# Patient Record
Sex: Female | Born: 1970 | Race: White | Hispanic: No | Marital: Single | Smoking: Never smoker
Health system: Southern US, Community
[De-identification: ages and names within clinical notes are randomized; demographics above are authoritative.]

## PROBLEM LIST (undated history)

## (undated) DIAGNOSIS — Z8481 Family history of carrier of genetic disease: Secondary | ICD-10-CM

## (undated) DIAGNOSIS — J189 Pneumonia, unspecified organism: Secondary | ICD-10-CM

## (undated) DIAGNOSIS — D649 Anemia, unspecified: Secondary | ICD-10-CM

## (undated) DIAGNOSIS — I499 Cardiac arrhythmia, unspecified: Secondary | ICD-10-CM

## (undated) DIAGNOSIS — F329 Major depressive disorder, single episode, unspecified: Secondary | ICD-10-CM

## (undated) DIAGNOSIS — M797 Fibromyalgia: Secondary | ICD-10-CM

## (undated) DIAGNOSIS — E785 Hyperlipidemia, unspecified: Secondary | ICD-10-CM

## (undated) DIAGNOSIS — C801 Malignant (primary) neoplasm, unspecified: Secondary | ICD-10-CM

## (undated) DIAGNOSIS — J40 Bronchitis, not specified as acute or chronic: Secondary | ICD-10-CM

## (undated) DIAGNOSIS — F32A Depression, unspecified: Secondary | ICD-10-CM

## (undated) DIAGNOSIS — T7840XA Allergy, unspecified, initial encounter: Secondary | ICD-10-CM

## (undated) DIAGNOSIS — D259 Leiomyoma of uterus, unspecified: Secondary | ICD-10-CM

## (undated) DIAGNOSIS — I493 Ventricular premature depolarization: Secondary | ICD-10-CM

## (undated) DIAGNOSIS — K5792 Diverticulitis of intestine, part unspecified, without perforation or abscess without bleeding: Secondary | ICD-10-CM

## (undated) DIAGNOSIS — K219 Gastro-esophageal reflux disease without esophagitis: Secondary | ICD-10-CM

## (undated) DIAGNOSIS — Z8719 Personal history of other diseases of the digestive system: Secondary | ICD-10-CM

## (undated) DIAGNOSIS — B009 Herpesviral infection, unspecified: Secondary | ICD-10-CM

## (undated) DIAGNOSIS — R109 Unspecified abdominal pain: Secondary | ICD-10-CM

## (undated) DIAGNOSIS — F900 Attention-deficit hyperactivity disorder, predominantly inattentive type: Secondary | ICD-10-CM

## (undated) DIAGNOSIS — E282 Polycystic ovarian syndrome: Secondary | ICD-10-CM

## (undated) HISTORY — DX: Gastro-esophageal reflux disease without esophagitis: K21.9

## (undated) HISTORY — DX: Malignant (primary) neoplasm, unspecified: C80.1

## (undated) HISTORY — PX: SQUAMOUS CELL CARCINOMA EXCISION: SHX2433

## (undated) HISTORY — DX: Polycystic ovarian syndrome: E28.2

## (undated) HISTORY — DX: Diverticulitis of intestine, part unspecified, without perforation or abscess without bleeding: K57.92

## (undated) HISTORY — DX: Herpesviral infection, unspecified: B00.9

## (undated) HISTORY — DX: Unspecified abdominal pain: R10.9

## (undated) HISTORY — PX: WISDOM TOOTH EXTRACTION: SHX21

## (undated) HISTORY — DX: Major depressive disorder, single episode, unspecified: F32.9

## (undated) HISTORY — DX: Allergy, unspecified, initial encounter: T78.40XA

## (undated) HISTORY — DX: Anemia, unspecified: D64.9

## (undated) HISTORY — DX: Fibromyalgia: M79.7

## (undated) HISTORY — DX: Hyperlipidemia, unspecified: E78.5

## (undated) HISTORY — PX: APPENDECTOMY: SHX54

## (undated) HISTORY — PX: DILATION AND CURETTAGE OF UTERUS: SHX78

## (undated) HISTORY — DX: Attention-deficit hyperactivity disorder, predominantly inattentive type: F90.0

## (undated) HISTORY — DX: Leiomyoma of uterus, unspecified: D25.9

## (undated) HISTORY — PX: COLON SURGERY: SHX602

## (undated) HISTORY — DX: Family history of carrier of genetic disease: Z84.81

## (undated) HISTORY — DX: Depression, unspecified: F32.A

## (undated) HISTORY — DX: Ventricular premature depolarization: I49.3

## (undated) HISTORY — PX: COLONOSCOPY: SHX174

---

## 1995-11-27 HISTORY — PX: SIGMOIDECTOMY: SHX176

## 1998-05-09 ENCOUNTER — Other Ambulatory Visit: Admission: RE | Admit: 1998-05-09 | Discharge: 1998-05-09 | Payer: Self-pay | Admitting: Family Medicine

## 1999-05-18 ENCOUNTER — Ambulatory Visit (HOSPITAL_COMMUNITY): Admission: RE | Admit: 1999-05-18 | Discharge: 1999-05-18 | Payer: Self-pay | Admitting: Gastroenterology

## 1999-05-26 ENCOUNTER — Encounter: Admission: RE | Admit: 1999-05-26 | Discharge: 1999-06-09 | Payer: Self-pay | Admitting: Family Medicine

## 1999-12-22 ENCOUNTER — Ambulatory Visit (HOSPITAL_COMMUNITY): Admission: RE | Admit: 1999-12-22 | Discharge: 1999-12-22 | Payer: Self-pay | Admitting: Cardiology

## 2000-08-16 ENCOUNTER — Encounter: Admission: RE | Admit: 2000-08-16 | Discharge: 2000-08-16 | Payer: Self-pay | Admitting: Family Medicine

## 2000-08-16 ENCOUNTER — Encounter: Payer: Self-pay | Admitting: Family Medicine

## 2000-10-21 ENCOUNTER — Other Ambulatory Visit: Admission: RE | Admit: 2000-10-21 | Discharge: 2000-10-21 | Payer: Self-pay | Admitting: Family Medicine

## 2002-03-17 ENCOUNTER — Other Ambulatory Visit: Admission: RE | Admit: 2002-03-17 | Discharge: 2002-03-17 | Payer: Self-pay | Admitting: Family Medicine

## 2002-04-29 ENCOUNTER — Ambulatory Visit (HOSPITAL_BASED_OUTPATIENT_CLINIC_OR_DEPARTMENT_OTHER): Admission: RE | Admit: 2002-04-29 | Discharge: 2002-04-29 | Payer: Self-pay | Admitting: Family Medicine

## 2003-03-31 ENCOUNTER — Other Ambulatory Visit: Admission: RE | Admit: 2003-03-31 | Discharge: 2003-03-31 | Payer: Self-pay | Admitting: Obstetrics and Gynecology

## 2004-06-08 ENCOUNTER — Other Ambulatory Visit: Admission: RE | Admit: 2004-06-08 | Discharge: 2004-06-08 | Payer: Self-pay | Admitting: Obstetrics and Gynecology

## 2004-11-01 ENCOUNTER — Emergency Department (HOSPITAL_COMMUNITY): Admission: EM | Admit: 2004-11-01 | Discharge: 2004-11-01 | Payer: Self-pay | Admitting: Emergency Medicine

## 2006-01-31 ENCOUNTER — Encounter: Admission: RE | Admit: 2006-01-31 | Discharge: 2006-01-31 | Payer: Self-pay | Admitting: Family Medicine

## 2006-09-24 ENCOUNTER — Encounter: Admission: RE | Admit: 2006-09-24 | Discharge: 2006-09-24 | Payer: Self-pay | Admitting: Family Medicine

## 2006-12-20 ENCOUNTER — Ambulatory Visit (HOSPITAL_COMMUNITY): Admission: RE | Admit: 2006-12-20 | Discharge: 2006-12-20 | Payer: Self-pay | Admitting: Gastroenterology

## 2007-02-07 IMAGING — CT CT ABDOMEN W/ CM
2 of 5 series · 14 of 42 positions shown, 18 images · IV contrast (READY/WATER & [ID] OMNI 300)
Comparison: none

CLINICAL DATA: 33-year-old female, left lower quadrant acute abdominal pain.  History of diverticulitis.  
ABDOMEN CT WITH CONTRAST:
TECHNIQUE: Multidetector CT imaging of the abdomen was performed following the standard protocol during bolus administration of intravenous contrast.
Contrast:  125 cc Omnipaque 300
TECHNIQUE: Multidetector CT imaging of the pelvis was performed following the standard protocol during bolus administration of intravenous contrast.

[Series 102: a&p w/ · axial · 0.70mm/px · z∈[-412,-3]mm · 11 of 377 slices shown, 15 images]
[im 33/377  soft-tissue]
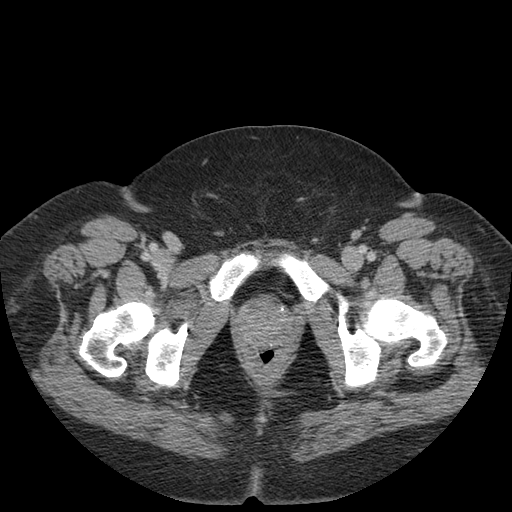
[im 33/377  bone]
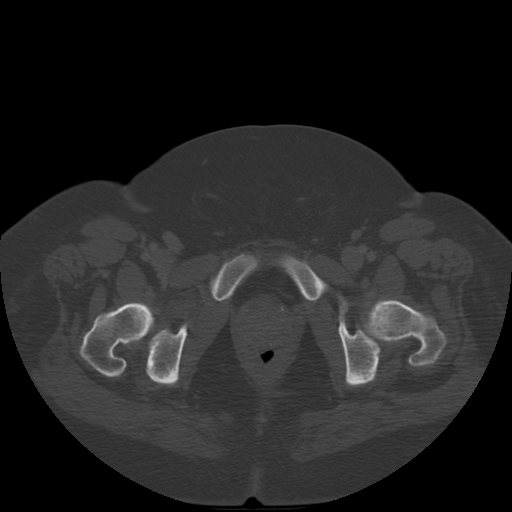
[im 66/377  soft-tissue]
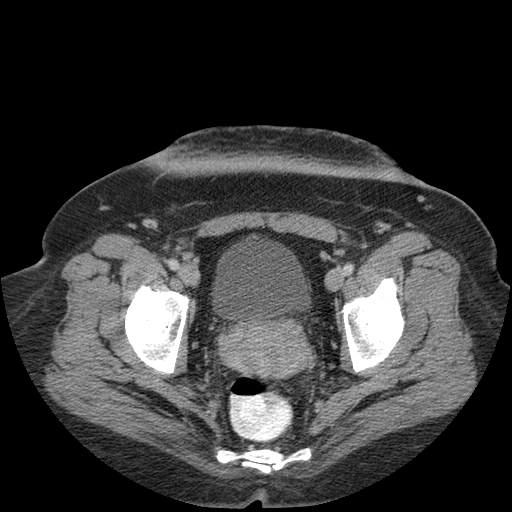
[im 115/377  soft-tissue]
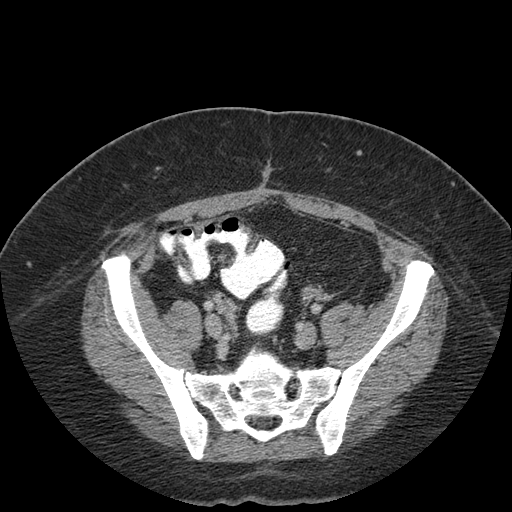
[im 148/377  soft-tissue]
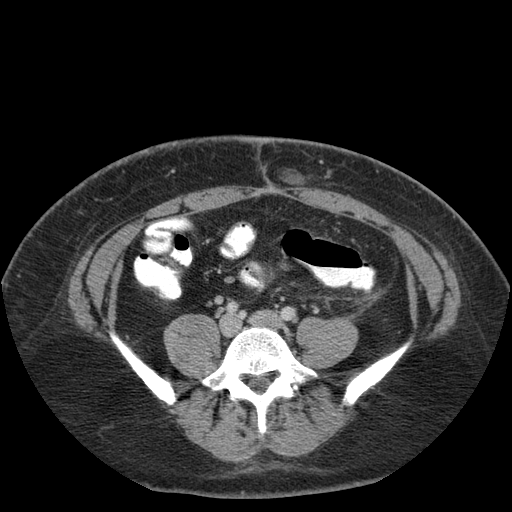
[im 197/377  soft-tissue]
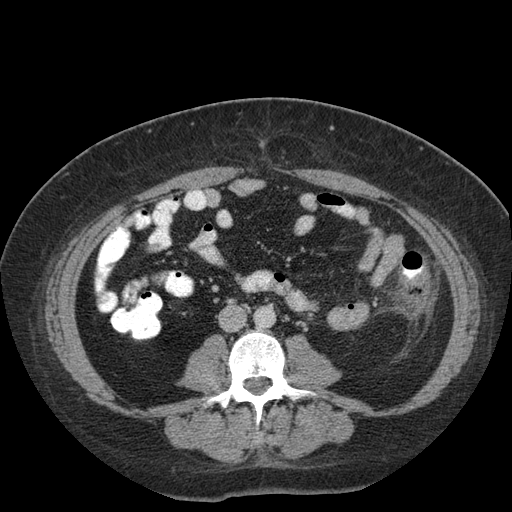
[im 229/377  soft-tissue]
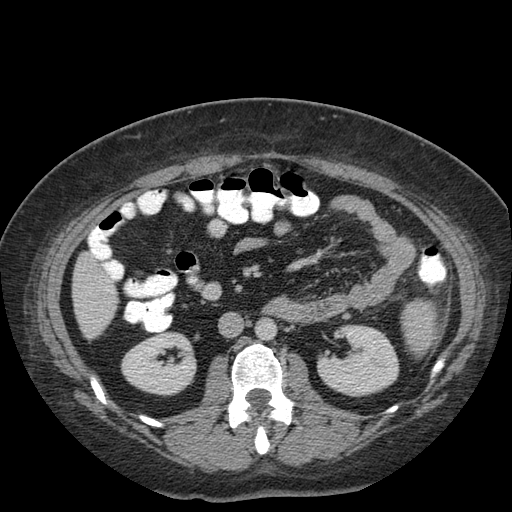
[im 262/377  soft-tissue]
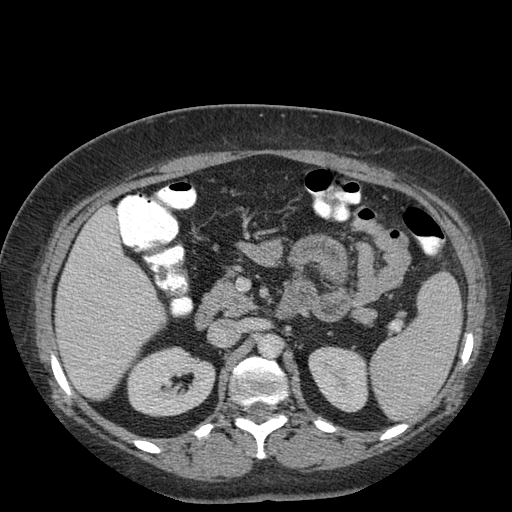
[im 311/377  soft-tissue]
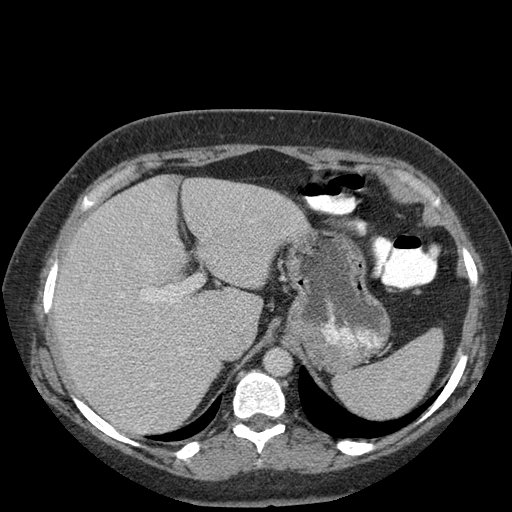
[im 311/377  lung]
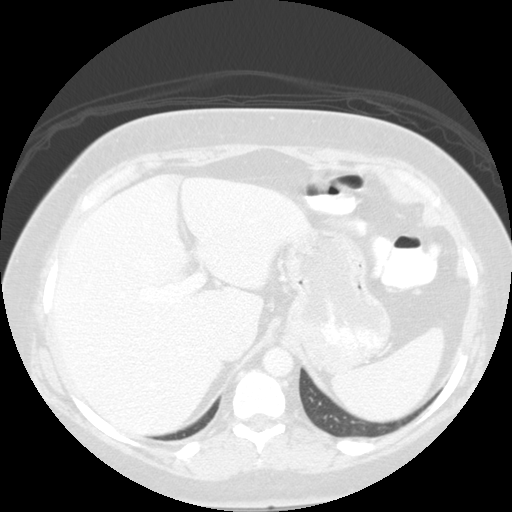
[im 327/377  lung]
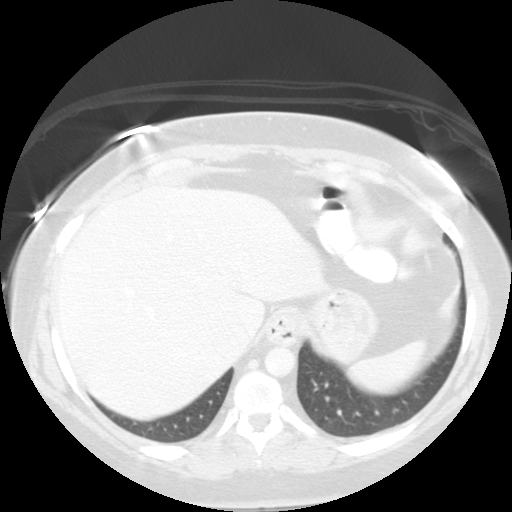
[im 344/377  soft-tissue]
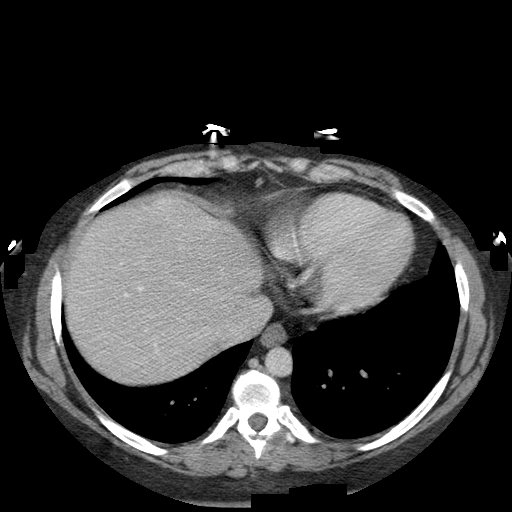
[im 344/377  lung]
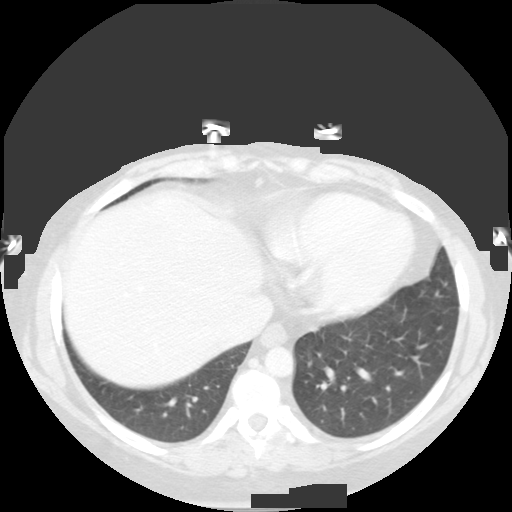
[im 344/377  bone]
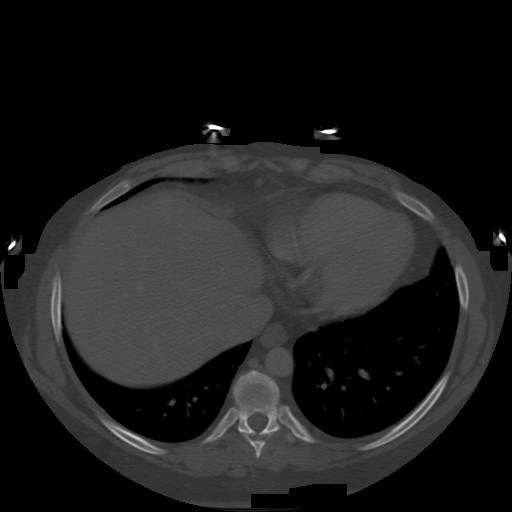
[im 360/377  lung]
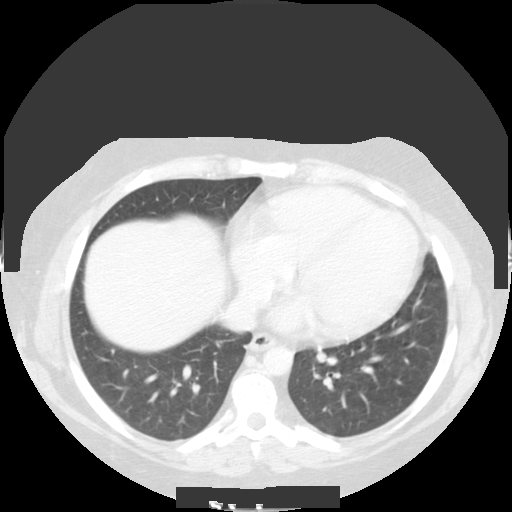

[Series 584: reformatted · sagittal · 0.93mm/px · 3 of 130 slices shown]
[im 26/130  soft-tissue]
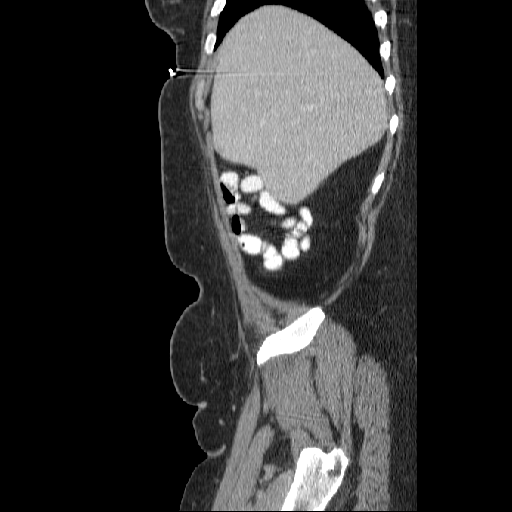
[im 52/130  soft-tissue]
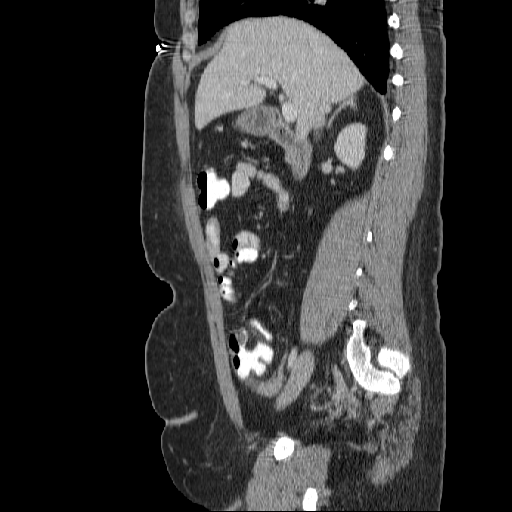
[im 78/130  soft-tissue]
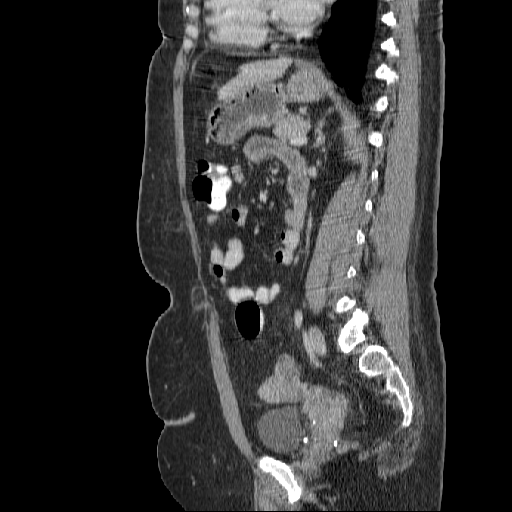

[14 of 42 positions shown; findings below may reference images not displayed]

FINDINGS: In the inferior right middle lobe, there is a subpleural 5 mm noncalcified nodule on image #3, nonspecific in appearance.  No additional lower lobe pulmonary nodules.  No pericardial or pleural fluid. 
In the abdomen, the liver, biliary system, gallbladder, adrenal glands, and pancreas are normal for age.  In the left abdomen, extending into the lower quadrant, there is acute inflammation around the descending colon with diverticular change.  No associated obstruction, dilatation, or fluid collection.  The findings are consistent with acute left diverticulitis.  In the midline, there is a ventral hernia with fat protrusion only just above the umbilicus.  Adjacent to the umbilicus, there is a second midline paraumbilical hernia also containing only fat.  No abdominal adenopathy, ascites, or definite free air.
IMPRESSION: 1.  Acute diverticulitis involving the left descending colon. 
2.  No associated obstruction, fluid collection, or abscess.  
3.  Midline ventral hernias as described containing only fat.  
4.  Noncalcified 5 mm inferior right middle lobe nodule, recommend six months followup. 
PELVIS CT WITH CONTRAST:
FINDINGS: The rectosigmoid segments are not particularly inflamed or thickened.  There is a trace amount of free pelvic fluid.  Pelvic calcifications are vascular.  Prominent small cysts versus follicles are present on the right ovary.  The left ovary is normal in appearance.  No adenopathy or other acute finding in the pelvis.
IMPRESSION: 1.  Probable small dominant follicles on the right ovary. 
2.  Trace free pelvic fluid.

## 2007-05-29 ENCOUNTER — Emergency Department (HOSPITAL_COMMUNITY): Admission: EM | Admit: 2007-05-29 | Discharge: 2007-05-29 | Payer: Self-pay | Admitting: Family Medicine

## 2008-08-27 ENCOUNTER — Emergency Department (HOSPITAL_COMMUNITY): Admission: EM | Admit: 2008-08-27 | Discharge: 2008-08-27 | Payer: Self-pay | Admitting: Family Medicine

## 2008-11-01 ENCOUNTER — Ambulatory Visit: Payer: Self-pay | Admitting: Radiology

## 2008-11-01 ENCOUNTER — Emergency Department (HOSPITAL_BASED_OUTPATIENT_CLINIC_OR_DEPARTMENT_OTHER): Admission: EM | Admit: 2008-11-01 | Discharge: 2008-11-01 | Payer: Self-pay | Admitting: Emergency Medicine

## 2009-01-20 ENCOUNTER — Ambulatory Visit (HOSPITAL_COMMUNITY): Admission: RE | Admit: 2009-01-20 | Discharge: 2009-01-20 | Payer: Self-pay | Admitting: Obstetrics and Gynecology

## 2009-01-20 ENCOUNTER — Encounter (INDEPENDENT_AMBULATORY_CARE_PROVIDER_SITE_OTHER): Payer: Self-pay | Admitting: Obstetrics and Gynecology

## 2009-02-23 ENCOUNTER — Ambulatory Visit: Admission: RE | Admit: 2009-02-23 | Discharge: 2009-02-23 | Payer: Self-pay | Admitting: Gynecologic Oncology

## 2009-12-13 ENCOUNTER — Ambulatory Visit (HOSPITAL_BASED_OUTPATIENT_CLINIC_OR_DEPARTMENT_OTHER): Admission: RE | Admit: 2009-12-13 | Discharge: 2009-12-13 | Payer: Self-pay | Admitting: Family Medicine

## 2009-12-13 ENCOUNTER — Ambulatory Visit: Payer: Self-pay | Admitting: Diagnostic Radiology

## 2010-02-14 ENCOUNTER — Ambulatory Visit (HOSPITAL_BASED_OUTPATIENT_CLINIC_OR_DEPARTMENT_OTHER): Admission: RE | Admit: 2010-02-14 | Discharge: 2010-02-14 | Payer: Self-pay | Admitting: Family Medicine

## 2010-02-14 ENCOUNTER — Ambulatory Visit: Payer: Self-pay | Admitting: Radiology

## 2010-04-25 ENCOUNTER — Encounter: Admission: RE | Admit: 2010-04-25 | Discharge: 2010-05-26 | Payer: Self-pay | Admitting: Sports Medicine

## 2010-08-01 ENCOUNTER — Encounter: Admission: RE | Admit: 2010-08-01 | Discharge: 2010-08-22 | Payer: Self-pay | Admitting: Sports Medicine

## 2010-09-08 ENCOUNTER — Ambulatory Visit (HOSPITAL_COMMUNITY): Admission: RE | Admit: 2010-09-08 | Discharge: 2010-09-08 | Payer: Self-pay | Admitting: Obstetrics and Gynecology

## 2010-09-26 HISTORY — PX: HERNIA REPAIR: SHX51

## 2010-10-09 ENCOUNTER — Inpatient Hospital Stay (HOSPITAL_COMMUNITY): Admission: RE | Admit: 2010-10-09 | Discharge: 2010-10-11 | Payer: Self-pay | Admitting: Surgery

## 2010-12-17 ENCOUNTER — Encounter: Payer: Self-pay | Admitting: Physician Assistant

## 2011-01-29 ENCOUNTER — Ambulatory Visit (INDEPENDENT_AMBULATORY_CARE_PROVIDER_SITE_OTHER): Payer: Commercial Managed Care - PPO | Admitting: Family Medicine

## 2011-01-29 DIAGNOSIS — J069 Acute upper respiratory infection, unspecified: Secondary | ICD-10-CM

## 2011-01-29 DIAGNOSIS — F329 Major depressive disorder, single episode, unspecified: Secondary | ICD-10-CM

## 2011-01-29 DIAGNOSIS — J45909 Unspecified asthma, uncomplicated: Secondary | ICD-10-CM

## 2011-01-29 DIAGNOSIS — F3289 Other specified depressive episodes: Secondary | ICD-10-CM

## 2011-02-06 LAB — URINALYSIS, ROUTINE W REFLEX MICROSCOPIC
Bilirubin Urine: NEGATIVE
Glucose, UA: NEGATIVE mg/dL
Urobilinogen, UA: 0.2 mg/dL (ref 0.0–1.0)

## 2011-02-06 LAB — DIFFERENTIAL
Basophils Absolute: 0.1 10*3/uL (ref 0.0–0.1)
Eosinophils Relative: 2 % (ref 0–5)
Lymphocytes Relative: 27 % (ref 12–46)
Lymphs Abs: 2.4 10*3/uL (ref 0.7–4.0)
Monocytes Relative: 6 % (ref 3–12)
Neutrophils Relative %: 64 % (ref 43–77)

## 2011-02-06 LAB — CBC
HCT: 31.1 % — ABNORMAL LOW (ref 36.0–46.0)
Hemoglobin: 9.8 g/dL — ABNORMAL LOW (ref 12.0–15.0)
MCH: 21.8 pg — ABNORMAL LOW (ref 26.0–34.0)
Platelets: 392 10*3/uL (ref 150–400)
RBC: 4.49 MIL/uL (ref 3.87–5.11)

## 2011-02-06 LAB — COMPREHENSIVE METABOLIC PANEL
BUN: 16 mg/dL (ref 6–23)
Chloride: 107 mEq/L (ref 96–112)
Creatinine, Ser: 1 mg/dL (ref 0.4–1.2)
GFR calc Af Amer: >60 mL/min (ref >60)
Potassium: 3.9 mEq/L (ref 3.5–5.1)

## 2011-02-06 LAB — PROTIME-INR
INR: 1 (ref 0.00–1.49)
Prothrombin Time: 13.4 seconds (ref 11.6–15.2)

## 2011-02-06 LAB — PREGNANCY, URINE: Preg Test, Ur: NEGATIVE

## 2011-02-08 LAB — CBC: WBC: 9.7 10*3/uL (ref 4.0–10.5)

## 2011-02-21 ENCOUNTER — Encounter: Payer: Commercial Managed Care - PPO | Admitting: Family Medicine

## 2011-03-05 ENCOUNTER — Encounter: Payer: Self-pay | Admitting: Family Medicine

## 2011-03-13 LAB — CBC
HCT: 29.5 % — ABNORMAL LOW (ref 36.0–46.0)
MCHC: 31.3 g/dL (ref 30.0–36.0)
MCV: 68.5 fL — ABNORMAL LOW (ref 78.0–100.0)
Platelets: 378 10*3/uL (ref 150–400)
RBC: 4.3 MIL/uL (ref 3.87–5.11)
WBC: 8.5 10*3/uL (ref 4.0–10.5)

## 2011-04-05 ENCOUNTER — Ambulatory Visit (INDEPENDENT_AMBULATORY_CARE_PROVIDER_SITE_OTHER): Payer: Commercial Managed Care - PPO | Admitting: Family Medicine

## 2011-04-05 ENCOUNTER — Encounter: Payer: Self-pay | Admitting: Family Medicine

## 2011-04-05 DIAGNOSIS — F900 Attention-deficit hyperactivity disorder, predominantly inattentive type: Secondary | ICD-10-CM | POA: Insufficient documentation

## 2011-04-05 DIAGNOSIS — Z79899 Other long term (current) drug therapy: Secondary | ICD-10-CM

## 2011-04-05 DIAGNOSIS — E7800 Pure hypercholesterolemia, unspecified: Secondary | ICD-10-CM | POA: Insufficient documentation

## 2011-04-05 DIAGNOSIS — B009 Herpesviral infection, unspecified: Secondary | ICD-10-CM

## 2011-04-05 DIAGNOSIS — R002 Palpitations: Secondary | ICD-10-CM | POA: Insufficient documentation

## 2011-04-05 DIAGNOSIS — E559 Vitamin D deficiency, unspecified: Secondary | ICD-10-CM

## 2011-04-05 DIAGNOSIS — Z0000 Encounter for general adult medical examination without abnormal findings: Secondary | ICD-10-CM

## 2011-04-05 DIAGNOSIS — D649 Anemia, unspecified: Secondary | ICD-10-CM | POA: Insufficient documentation

## 2011-04-05 DIAGNOSIS — F988 Other specified behavioral and emotional disorders with onset usually occurring in childhood and adolescence: Secondary | ICD-10-CM

## 2011-04-05 DIAGNOSIS — K219 Gastro-esophageal reflux disease without esophagitis: Secondary | ICD-10-CM

## 2011-04-05 LAB — TSH: TSH: 2.248 u[IU]/mL (ref 0.350–4.500)

## 2011-04-05 LAB — POCT URINALYSIS DIPSTICK
Bilirubin, UA: NEGATIVE
Glucose, UA: NEGATIVE

## 2011-04-05 MED ORDER — VALACYCLOVIR HCL 500 MG PO TABS: 500.0000 mg | ORAL_TABLET | Freq: Two times a day (BID) | ORAL | Status: DC

## 2011-04-05 MED ORDER — AMPHETAMINE-DEXTROAMPHET ER 30 MG PO CP24: 30.0000 mg | ORAL_CAPSULE | ORAL | Status: DC

## 2011-04-05 MED ORDER — AMPHETAMINE-DEXTROAMPHETAMINE 5 MG PO TABS: 5.0000 mg | ORAL_TABLET | Freq: Every day | ORAL | Status: DC

## 2011-04-05 MED ORDER — ESOMEPRAZOLE MAGNESIUM 40 MG PO CPDR: 40.0000 mg | DELAYED_RELEASE_CAPSULE | Freq: Every day | ORAL | Status: DC

## 2011-04-05 MED ORDER — TOPROL XL 50 MG PO TB24: 50.0000 mg | ORAL_TABLET | Freq: Every day | ORAL | Status: DC
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
## 2011-04-06 LAB — IBC PANEL
TIBC: 396 ug/dL (ref 250–470)
UIBC: 360 ug/dL

## 2011-04-06 LAB — COMPREHENSIVE METABOLIC PANEL
ALT: 13 U/L (ref 0–35)
AST: 15 U/L (ref 0–37)
Albumin: 4.1 g/dL (ref 3.5–5.2)
Alkaline Phosphatase: 51 U/L (ref 39–117)
Calcium: 8.9 mg/dL (ref 8.4–10.5)
Potassium: 4.8 mEq/L (ref 3.5–5.3)
Sodium: 140 mEq/L (ref 135–145)

## 2011-04-06 LAB — CBC WITH DIFFERENTIAL/PLATELET
Basophils Relative: 1 % (ref 0–1)
Eosinophils Absolute: 0.2 10*3/uL (ref 0.0–0.7)
HCT: 34.8 % — ABNORMAL LOW (ref 36.0–46.0)
Hemoglobin: 10.3 g/dL — ABNORMAL LOW (ref 12.0–15.0)
Lymphs Abs: 2.7 10*3/uL (ref 0.7–4.0)
MCH: 22.1 pg — ABNORMAL LOW (ref 26.0–34.0)
MCV: 74.7 fL — ABNORMAL LOW (ref 78.0–100.0)
Neutro Abs: 5.6 10*3/uL (ref 1.7–7.7)
Neutrophils Relative %: 61 % (ref 43–77)
RDW: 18.1 % — ABNORMAL HIGH (ref 11.5–15.5)
WBC: 9.2 10*3/uL (ref 4.0–10.5)

## 2011-04-06 LAB — LIPID PANEL: HDL: 34 mg/dL — ABNORMAL LOW (ref >39)

## 2011-04-06 LAB — IRON: Iron: 36 ug/dL — ABNORMAL LOW (ref 42–145)

## 2011-04-09 ENCOUNTER — Telehealth: Payer: Self-pay | Admitting: *Deleted

## 2011-04-09 DIAGNOSIS — D509 Iron deficiency anemia, unspecified: Secondary | ICD-10-CM

## 2011-04-09 NOTE — Telephone Encounter (Signed)
Referral ordered

## 2011-04-09 NOTE — Telephone Encounter (Signed)
Message copied by Debbrah Alar on Mon Apr 09, 2011  5:09 PM ------      Message from: Joselyn Arrow      Created: Fri Apr 06, 2011  3:30 PM       Advise pt -- 1) iron deficiency anemia.  Hg still 10.3, and iron stores are low.  Likely not absorbing the iron well because of her PPI. Had GI workup.  So, at this point, if compliant with taking iron, and still not improvement, consider referral to hematologist and iron infusion. 2) c-met, thyroid and Vitamin D normal 3) HDL low, and LDL borderline high.  Daily exercise and 01-3999 gm of omega 3 fish oil can help raise HDL.  Lowfat, low cholesterol diet will lower LDL and total cholesterol. Re-check FLP 6 mos

## 2011-04-09 NOTE — Telephone Encounter (Signed)
Left message on pt cell number for her to call me back re:lab results.

## 2011-04-09 NOTE — Telephone Encounter (Signed)
Spoke with patient, went over lab results. Pt has already sch 6 mo FLP follow up and agreed to taking 3,000-4,000mg  of omega 3 fish oil. Patient also agreed to hematology consult, just need consult ordered in system. Thanks.

## 2011-04-10 NOTE — Op Note (Signed)
NAME:  Pacholski, Yolani                   ACCOUNT NO.:  [id number]   MEDICAL RECORD NO.:  [id number]         PATIENT TYPE:  WAMB   LOCATION:                                FACILITY:  [facility]   PHYSICIAN:  Duke Salvia. Marcelle Overlie, M.D.DATE OF BIRTH:  11/04/1974   DATE OF PROCEDURE:  DATE OF DISCHARGE:                               OPERATIVE REPORT   PREOPERATIVE DIAGNOSIS:  Vulvar intraepithelial neoplasia III.   POSTOPERATIVE DIAGNOSIS:  Vulvar intraepithelial neoplasia III.   PROCEDURE:  Wide excision of VIN III.   SURGEON:  Duke Salvia. Marcelle Overlie, MD   ANESTHESIA:  Local plus MAC.   SPECIMENS REMOVED:  Vulvar lesion.   BLEEDING:  Minimal.   PROCEDURE AND FINDINGS:  The patient was taken to the operating room.  After an adequate level of MAC was obtained, the legs in stirrups, the  vulvar area was prepped and draped.  The left vulvar lesion was  identified.  Local anesthetic 1% Xylocaine with epi was used to  infiltrate.  The lesion was excised in an ellipse trying to get normal  skin around all edges.  Once the specimen was excised, 3-4 deeper  sutures of 3-0 Vicryl Rapide were placed after assuring hemostasis with  the Bovie.  The skin approximated under no tension with 3-0 Vicryl  interrupted sutures starting with 3-0 Vicryl Rapide interrupted sutures.  There was no evidence of bleeding at the end of the procedure.  She  tolerated this well, went to recovery room in good condition.      Richard M. Marcelle Overlie, M.D.  Electronically Signed     RMH/MEDQ  D:  01/20/2009  T:  01/21/2009  Job:  [id number]

## 2011-04-10 NOTE — H&P (Signed)
NAME:  Brenda Little, Brenda Little                   ACCOUNT NO.:  [id number]   MEDICAL RECORD NO.:  [id number]          PATIENT TYPE:  AMB   LOCATION:  SDC                           FACILITY:  [facility]   PHYSICIAN:  Duke Salvia. Marcelle Overlie, M.D.DATE OF BIRTH:  1974/07/06   DATE OF ADMISSION:  DATE OF DISCHARGE:                              HISTORY & PHYSICAL   CHIEF COMPLAINT:  Vulvar dysplasia.   HPI:  A 40 year old, G2, P0, currently using condoms presented for a new  GYN appointment in January 2010 complaining of an irritated area on the  vulva.  She subsequently had vulvar biopsy with a Lorrin Mais Punch that  showed vulvar CIS.  She presents now for wide excision.  This procedure  including risk of bleeding, infection, the possible need for further  followup surgery, all reviewed with her which she understands and  accepts.   PAST MEDICAL HISTORY:   ALLERGIES:  CODEINE.   CURRENT MEDICATIONS:  1. Toprol XL.  2. Nexium.  3. Prozac.  4. Albuterol p.r.n.  5. Chromagen.   PAST SURGICAL HISTORY:  1. Sigmoid colectomy in 1998.  2. Appendectomy in 1987.   PRIMARY CARE DOCTOR:  Dr. Smith Mince.   REVIEW OF SYSTEMS:  Significant for a prior history of migraine  headache, asthma, iron deficiency anemia, and diverticular disease.   FAMILY HISTORY:  Significant for mother and brother with diverticulosis.  Father with diabetes.  Both parents with hypertension and mother with  thyroid disease.  We obtained HSV-1 and 2, both of which were positive.   PHYSICAL EXAM:  Temp 98.2.  Blood pressure 90/60.  HEENT:  Unremarkable.  NECK:  Supple without masses.  LUNGS:  Clear.  CARDIOVASCULAR:  Rate rate and rhythm without murmurs, rubs, or gallops  noted.  BREASTS:  Without masses.  ABDOMEN:  Soft, flat, nontender.  PELVIC EXAM:  External genitalia showed a 2-cm lesion in the left vulvar  area that had been previously biopsied.  Vagina and cervix normal.  Her  Pap returned normal dated January 2010.  Uterus  midposition, normal  size.  Adnexa negative.  EXTREMITIES:  Unremarkable.  NEUROLOGIC EXAM:  Unremarkable.   IMPRESSION:  Vulvar intraepithelial neoplasia 3.   PLAN:  Wide excision of VIN-3.  Procedure and risks reviewed as above.      Richard M. Marcelle Overlie, M.D.  Electronically Signed     RMH/MEDQ  D:  01/20/2009  T:  01/20/2009  Job:  [id number]

## 2011-04-10 NOTE — Consult Note (Signed)
NAME:  Brenda Little, Brenda Little                   ACCOUNT NO.:  [id number]   MEDICAL RECORD NO.:  [id number]          PATIENT TYPE:  OUT   LOCATION:  GYN                          [facility]ACILITY:  [facility]   PHYSICIAN:  John T. Kyla Balzarine, M.D.    DATE OF BIRTH:  11/04/1974   DATE OF CONSULTATION:  02/23/2009  DATE OF DISCHARGE:                                 CONSULTATION   CHIEF COMPLAINT:  This 40 year old woman is seen at the request of Dr.  Richarda Overlie with a squamous cell carcinoma of the vulva.   HISTORY OF PRESENT ILLNESS:  The patient relates a long history of a  lesion of vulvar irritation which involved the left vulva.  This was  evaluated approximately 1 year ago with vulvar Pap smear that was  negative.  She presented to Dr. Marcelle Overlie who saw an erythematous region  with biopsy revealing vulvar carcinoma in situ.  On February 25 she  underwent wide local excision of the left vulva.  Findings at the time  of surgery reveal a 1.3 cm tumor largely comprised of VIN III with  very focal invasion identified with a depth less than 1 mm (0.85 mm).  There was no lymphovascular space invasion and margins were free of high-  grade dysplasia.  The patient relates no prior history of condylomata or  CIN and recent Pap smear was normal.   PAST MEDICAL HISTORY:  Significant for migraine headaches, asthma, iron  deficiency anemia, diverticular disease, fibromyalgia and GERD.  She is  post appendectomy and partial colectomy with reanastomosis for  diverticular disease.  The patient has a known ventral hernia which has  been managed conservatively.   MEDICATIONS:  Toprol XL, Nexium, Prozac, albuterol p.r.n., Chromagen.   ALLERGIES:  CODEINE.   PERSONAL AND SOCIAL HISTORY:  The patient denies tobacco and admits rare  social ethanol.   FAMILY HISTORY:  Significant for mother and brother with diverticulosis  and father with diabetes but no breast, ovarian or colon cancer.   REVIEW OF SYSTEMS:  Otherwise  negative.   EXAM:  VITAL SIGNS:  Weight 218 pounds, and vital signs stable,  afebrile.  CONSTITUTIONAL:  The patient is alert and oriented x3 in no acute  distress.  LYMPHATICS:  Survey negative for pathologic lymphadenopathy.  Pain score  0.  BACK:  No spinous or CVA tenderness.  ABDOMEN:  Obese, soft and benign with well-healed midline incision.  There is a reducible hernia of in the mid slash periumbilical portion  the incision.  No entrapped bowel, tenderness, ascites or mass.  EXTREMITIES:  Full strength and range of motion without edema, cords or  Homan's.  PELVIC:  External genitalia and BUS normal with inspection and palpation  with healed incision line.  There is diffuse erythema, with no  acetowhite lesions noted by direct evaluation after prolonged acetic  acid staining.  Vagina and cervix normal.  Bimanual and rectovaginal  examinations reveal normal uterus with no adnexal pathology.   ASSESSMENT:  Vulvar intraepithelial neoplasia III with focal  microinvasion less than 1 mm; risk of groin node metastasis  is less than  1%.  By pathology report, margins are widely free of invasive malignancy  or high-grade dysplasia.   RECOMMENDATIONS:  I had a long discussion with the patient regarding the  nature of the disease ant its relationship to HPV, raising the  possibility of multifocal VIN in the future.  Because of the very early  focal nature of her stromal invasion, the risk of lymphadenectomy would  far outweigh the risk of positive groin nodes and I would recommend no  further treatment at present.  Furthermore, the outlook for lesions of  this type more closely mimic VIN III than invasive vulvar cancer; she is  at approximately a 20-25% risk of recurrence whether margins are totally  free of dysplasia or are involved.  Clinical followup is recommended at  62-month intervals for the first year following surgery and then at 6-  month intervals for at least another 3 to 4  years.  She should continue  cervical cytology and I would recommend inspection of the vulva with  acetic acid staining and liberal followup biopsies.  We would be glad to  see her back at any time on a p.r.n. basis but the bulk of this followup  could be carried out by Dr. Marcelle Overlie.      John T. Kyla Balzarine, M.D.  Electronically Signed     JTS/MEDQ  D:  02/23/2009  T:  02/23/2009  Job:  [id number]   cc:   Duke Salvia. Marcelle Overlie, M.D.  Fax: [phone]   Telford Nab, R.N.  [address]  [place], [place] [address]

## 2011-04-11 NOTE — Telephone Encounter (Signed)
Ordered referral with Dr.Mohammed at the [facility], waiting on them to sch pt appt. vs

## 2011-04-13 ENCOUNTER — Other Ambulatory Visit: Payer: Self-pay | Admitting: Oncology

## 2011-04-13 ENCOUNTER — Encounter (HOSPITAL_BASED_OUTPATIENT_CLINIC_OR_DEPARTMENT_OTHER): Payer: 59 | Admitting: Oncology

## 2011-04-13 DIAGNOSIS — D509 Iron deficiency anemia, unspecified: Secondary | ICD-10-CM

## 2011-04-13 LAB — MORPHOLOGY: PLT EST: ADEQUATE

## 2011-04-13 LAB — CBC & DIFF AND RETIC
Basophils Absolute: 0 10*3/uL (ref 0.0–0.1)
Eosinophils Absolute: 0.2 10*3/uL (ref 0.0–0.5)
HCT: 33.2 % — ABNORMAL LOW (ref 34.8–46.6)
HGB: 10.1 g/dL — ABNORMAL LOW (ref 11.6–15.9)
Immature Retic Fract: 6.6 % (ref 0.00–10.70)
MCH: 22.1 pg — ABNORMAL LOW (ref 25.1–34.0)
MCV: 72.5 fL — ABNORMAL LOW (ref 79.5–101.0)
NEUT#: 5.9 10*3/uL (ref 1.5–6.5)
NEUT%: 65.7 % (ref 38.4–76.8)
RDW: 17.1 % — ABNORMAL HIGH (ref 11.2–14.5)
Retic Ct Abs: 45.34 10*3/uL (ref 18.30–72.70)
lymph#: 2.3 10*3/uL (ref 0.9–3.3)

## 2011-04-13 LAB — CHCC SMEAR

## 2011-04-13 NOTE — Op Note (Signed)
NAME:  Brenda Little, Brenda Little                   ACCOUNT NO.:  [id number]   MEDICAL RECORD NO.:  [id number]          PATIENT TYPE:  AMB   LOCATION:  ENDO                         FACILITY:  [facility]   PHYSICIAN:  Petra Kuba, M.D.    DATE OF BIRTH:  11/04/1972   DATE OF PROCEDURE:  12/20/2006  DATE OF DISCHARGE:                               OPERATIVE REPORT   SURGEON:  Petra Kuba, M.D.   PROCEDURE:  Colonoscopy.   INDICATIONS:  Iron deficiency anemia, diarrhea.  Consent was signed  after the risks, benefits, methods and options were thoroughly discussed  multiple times in the past.   MEDICINES USED:  Fentanyl 100 mcg, Versed 8 mg.   DESCRIPTION OF PROCEDURE:  Rectal inspection was pertinent for external  hemorrhoids, small.  Digital exam was negative.  The video pediatric  adjustable colonoscope was inserted, and easily advanced around the  colon to the cecum.  This did require minimal abdominal pressure, but no  position changes.  Her anastomosis was seen in the distal-to-midsigmoid.  She did have some descending residual diverticula.  No other  abnormalities were seen on insertion.  We did advance a short way into  the terminal ileum, which was normal.  Photodocumentation was obtained.  No blood was seen coming from above.  The scope was slowly withdrawn.  The prep was adequate.  There was some liquid stool that required  washing and suctioning, and on slow withdrawal through the colon, no  abnormalities were seen, other than mentioned above, specifically, no  signs of bleeding, AVMs, polyps, tumors or masses.  The diverticula  mentioned above were confirmed.  Once back in the rectum, anorectal pull-  through and retroflexion confirmed some small hemorrhoids.  The scope  was straightened and readvanced a short way up the left side of the  colon.  Air was suctioned and the scope removed.  The patient tolerated  the procedure well.  There was no obvious immediate complication.   ENDOSCOPIC  DIAGNOSES:  1. Internal and external small hemorrhoids.  2. Sigmoid anastomosis.  3. Moderate left-sided diverticula.  4. Otherwise, within normal limits to the terminal ileum.   PLAN:  1. Repeat colonoscopy at age 76 for screening.  2. Follow up p.r.n., or in 6 weeks to recheck, CBC, symptoms, positive      guaiacs again, and decide any other workup and plan.  If she has      not been checked for sprue, we will probably do the sprue blood      test at that time just to be sure.  Have her call me sooner p.r.n.           ______________________________  Petra Kuba, M.D.     MEM/MEDQ  D:  12/20/2006  T:  12/20/2006  Job:  [id number]   cc:   Talmadge Coventry, M.D.

## 2011-04-16 LAB — COMPREHENSIVE METABOLIC PANEL
Albumin: 4 g/dL (ref 3.5–5.2)
Alkaline Phosphatase: 45 U/L (ref 39–117)
BUN: 19 mg/dL (ref 6–23)
Calcium: 9 mg/dL (ref 8.4–10.5)
Glucose, Bld: 103 mg/dL — ABNORMAL HIGH (ref 70–99)
Potassium: 4.4 mEq/L (ref 3.5–5.3)

## 2011-04-16 LAB — IRON AND TIBC
%SAT: 7 % — ABNORMAL LOW (ref 20–55)
Iron: 25 ug/dL — ABNORMAL LOW (ref 42–145)
TIBC: 380 ug/dL (ref 250–470)
UIBC: 355 ug/dL

## 2011-04-16 LAB — FERRITIN: Ferritin: 6 ng/mL — ABNORMAL LOW (ref 10–291)

## 2011-04-16 LAB — FOLATE RBC: RBC Folate: 999 ng/mL

## 2011-04-25 ENCOUNTER — Encounter (HOSPITAL_BASED_OUTPATIENT_CLINIC_OR_DEPARTMENT_OTHER): Payer: 59 | Admitting: Oncology

## 2011-04-25 DIAGNOSIS — D509 Iron deficiency anemia, unspecified: Secondary | ICD-10-CM

## 2011-05-02 ENCOUNTER — Encounter (HOSPITAL_BASED_OUTPATIENT_CLINIC_OR_DEPARTMENT_OTHER): Payer: Self-pay | Admitting: Oncology

## 2011-05-02 DIAGNOSIS — D509 Iron deficiency anemia, unspecified: Secondary | ICD-10-CM

## 2011-08-14 ENCOUNTER — Telehealth: Payer: Self-pay | Admitting: Family Medicine

## 2011-08-14 ENCOUNTER — Other Ambulatory Visit: Payer: Self-pay | Admitting: Medical

## 2011-08-14 MED ORDER — FLUOXETINE HCL 40 MG PO CAPS: 40.0000 mg | ORAL_CAPSULE | Freq: Every day | ORAL | Status: DC

## 2011-08-14 NOTE — Telephone Encounter (Signed)
E-rxd

## 2011-08-22 ENCOUNTER — Other Ambulatory Visit: Payer: Self-pay | Admitting: Family Medicine

## 2011-08-22 NOTE — Telephone Encounter (Signed)
Is this ok?  CM,LPN 

## 2011-08-22 NOTE — Telephone Encounter (Signed)
Looks like this was already done (twice? Once by me and once by [facility]) on 9/18

## 2011-10-10 ENCOUNTER — Encounter: Payer: Commercial Managed Care - PPO | Admitting: Family Medicine

## 2011-10-17 ENCOUNTER — Ambulatory Visit (INDEPENDENT_AMBULATORY_CARE_PROVIDER_SITE_OTHER): Payer: 59 | Admitting: Family Medicine

## 2011-10-17 DIAGNOSIS — D649 Anemia, unspecified: Secondary | ICD-10-CM

## 2011-10-17 DIAGNOSIS — B009 Herpesviral infection, unspecified: Secondary | ICD-10-CM

## 2011-10-17 DIAGNOSIS — F900 Attention-deficit hyperactivity disorder, predominantly inattentive type: Secondary | ICD-10-CM

## 2011-10-17 DIAGNOSIS — F988 Other specified behavioral and emotional disorders with onset usually occurring in childhood and adolescence: Secondary | ICD-10-CM

## 2011-10-17 LAB — CBC WITH DIFFERENTIAL/PLATELET
Basophils Absolute: 0 10*3/uL (ref 0.0–0.1)
Eosinophils Absolute: 0.1 10*3/uL (ref 0.0–0.7)
Eosinophils Relative: 1 % (ref 0–5)
Lymphocytes Relative: 19 % (ref 12–46)
Lymphs Abs: 1.6 10*3/uL (ref 0.7–4.0)
Neutrophils Relative %: 71 % (ref 43–77)
Platelets: 306 10*3/uL (ref 150–400)
RBC: 4.64 MIL/uL (ref 3.87–5.11)
RDW: 13.2 % (ref 11.5–15.5)
WBC: 8.9 10*3/uL (ref 4.0–10.5)

## 2011-10-17 MED ORDER — ALBUTEROL SULFATE HFA 108 (90 BASE) MCG/ACT IN AERS: 2.0000 | INHALATION_SPRAY | Freq: Four times a day (QID) | RESPIRATORY_TRACT | Status: DC | PRN

## 2011-10-17 MED ORDER — AMPHETAMINE-DEXTROAMPHET ER 30 MG PO CP24: 30.0000 mg | ORAL_CAPSULE | ORAL | Status: DC

## 2011-10-17 MED ORDER — VALACYCLOVIR HCL 1 G PO TABS: 1000.0000 mg | ORAL_TABLET | Freq: Every day | ORAL | Status: DC

## 2011-10-17 NOTE — Progress Notes (Signed)
Patient presents for ADD follow up and other med refills.  ADD:  She takes the ADD when she is working or in class. She takes the XR in the mornings most days, but only takes the 5 mg if she has a long day, or is having trouble concentrating on her homework in the evening.  She doesn't need refill of the 5mg .  Denies any side effects.  Herpes lesions in her nose--She had been getting outbreaks when on 500mg  of Valtrex daily, has been taking 500 mg BID with better results.  Needs refill  Asthma--needs refill of albuterol inhaler.  She has been completely out.  Needs it more frequently in the winter.  On average, was using it twice a week (including the winter).  Recalls being on Singulair at one point, maybe only for about a month, and can't recall if it made a difference.  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  She got an iron infusion, and felt a whole lot better, but recently feels more fatigued, like when she was anemic previously.  She is past due for f/u with them, as she had to cancel her f/u visit and hadn't rescheduled.  Would like Hg checked today.  Past Medical History  Diagnosis Date  . Allergy   . Asthma   . PCOS (polycystic ovarian syndrome)   . Cancer     VAGINAL/VULVAR  . Uterine fibroid   . Hyperlipidemia   . Vitamin D deficiency   . Fibromyalgia   . Anemia   . Migraine   . GERD (gastroesophageal reflux disease)   . HSV-2 (herpes simplex virus 2) infection     PER BLOOD TEST  . ADD (attention deficit hyperactivity disorder, inattentive type)   . Depression   . PVC's (premature ventricular contractions)     DR.LITTLE    Past Surgical History  Procedure Date  . Squamous cell carcinoma excision 2010+2011    VULVAR CA EXCISION X 2 (DR HOLLAND)  . Sigmoidectomy 1997    DIVERTICULITS  . Colonoscopy 2000/2008  . Hernia repair 09/2010    VENTRAL  . Appendectomy     History   Social History  . Marital Status: Single   Spouse Name: N/A    Number of Children: N/A  . Years of Education: N/A   Occupational History  . Not on file.   Social History Main Topics  . Smoking status: Never Smoker   . Smokeless tobacco: Never Used  . Alcohol Use: Yes     social,1-2 drinks every other week  . Drug Use: No  . Sexually Active: Not Currently    Birth Control/ Protection: Pill   Other Topics Concern  . Not on file   Social History Narrative  . No narrative on file    Family History  Problem Relation Age of Onset  . Hypertension Mother   . Fibromyalgia Mother   . Heart attack Father   . Sarcoidosis Father   . ADD / ADHD Sister   . Asthma Brother   . ADD / ADHD Brother   . Cancer Maternal Aunt 40    breast  . Cancer Maternal Aunt 40    breast   Current Outpatient Prescriptions on File Prior to Visit  Medication Sig Dispense Refill  . cholecalciferol (VITAMIN D) 1000 UNITS tablet Take 1,000 Units by mouth daily.        Marland Kitchen esomeprazole (NEXIUM) 40 MG capsule Take 1 capsule (40 mg  total) by mouth daily before breakfast.  90 capsule  3  . FLUoxetine (PROZAC) 40 MG capsule Take 1 capsule (40 mg total) by mouth daily.  90 capsule  0  . Norethin-Eth Estrad-Fe Biphas (LO LOESTRIN FE) 1 MG-10 MCG / 10 MCG TABS Take by mouth daily.        . TOPROL XL 50 MG 24 hr tablet Take 1 tablet (50 mg total) by mouth daily.  90 tablet  3  . DISCONTD: valACYclovir (VALTREX) 500 MG tablet Take 1 tablet (500 mg total) by mouth 2 (two) times daily.  60 tablet  5  . amphetamine-dextroamphetamine (ADDERALL) 5 MG tablet Take 1 tablet (5 mg total) by mouth daily.  90 tablet  0  . Iron-Folic Acid-B12-C-Docusate (FERRAPLUS 90 PO) Take 2 tablets by mouth daily.        Marland Kitchen DISCONTD: amphetamine-dextroamphetamine (ADDERALL XR) 30 MG 24 hr capsule Take 1 capsule (30 mg total) by mouth every morning.  90 capsule  0    Allergies  Allergen Reactions  . Codeine   . Latex    ROS:  Denies fevers, URI symptoms, chest pain, palpitations.  +c/o fatigue.  Denies GI or GU complaints, edema, skin rash, or other concerns.  See HPI.  PHYSICAL EXAM: BP 122/78  Pulse 68  Ht 5\' 5"  (1.651 m)  Wt 214 lb (97.07 kg)  BMI 35.61 kg/m2 Well developed, pleasant female in no distress Neck: no lymphadenopathy or thyromegaly. Heart: regular rate and rhythm without murmur Lungs: clear bilaterally Abdomen:  Mildly tender in epigastrium (pt states since her last surgery). No organomegaly or mass Extremities: no clubbing, cyanosis or edema Skin: no rash Psych: normal mood, affect, hygiene and grooming  ASSESSMENT/PLAN: 1. ADD (attention deficit hyperactivity disorder, inattentive type)  amphetamine-dextroamphetamine (ADDERALL XR) 30 MG 24 hr capsule  2. Anemia, unspecified  CBC with Differential  3. HSV-2 infection  valACYclovir (VALTREX) 1000 MG tablet   ADD--refill Adderall XR for 90 days Iron deficiency anemia--Check CBC today.  If ongoing anemia, patient will follow up with hematology (she can call to reschedule herself) HSV--change to 1000 mg of Valtrex #90 rf x 3 Asthma--refill albuterol.  If needing more than twice weekly as rescue inhaler, needs to be on preventative med (whether it be restarting Singulair, vs inhaled steroid).  Allergies tend to flare in the spring, which exacerbates her asthma. Consider starting back on Singulair in the Spring

## 2011-10-26 ENCOUNTER — Telehealth: Payer: Self-pay | Admitting: Family Medicine

## 2011-10-26 NOTE — Telephone Encounter (Signed)
Over weekend she can try ice pack, Ibuprofen OTC, avoid chewing tough things like meat or gum.  If need be can come in Monday.

## 2011-11-12 ENCOUNTER — Encounter: Payer: Self-pay | Admitting: Family Medicine

## 2011-11-12 ENCOUNTER — Ambulatory Visit (INDEPENDENT_AMBULATORY_CARE_PROVIDER_SITE_OTHER): Payer: 59 | Admitting: Family Medicine

## 2011-11-12 ENCOUNTER — Ambulatory Visit (HOSPITAL_BASED_OUTPATIENT_CLINIC_OR_DEPARTMENT_OTHER)
Admission: RE | Admit: 2011-11-12 | Discharge: 2011-11-12 | Disposition: A | Discharge status: Home / Self Care | Payer: 59 | Source: Ambulatory Visit | Attending: Family Medicine | Admitting: Family Medicine

## 2011-11-12 ENCOUNTER — Telehealth: Payer: Self-pay | Admitting: Internal Medicine

## 2011-11-12 VITALS — BP 128/76 | HR 84 | Ht 65.0 in | Wt 217.0 lb

## 2011-11-12 DIAGNOSIS — R1011 Right upper quadrant pain: Secondary | ICD-10-CM

## 2011-11-12 DIAGNOSIS — R112 Nausea with vomiting, unspecified: Secondary | ICD-10-CM | POA: Insufficient documentation

## 2011-11-12 LAB — CBC WITH DIFFERENTIAL/PLATELET
Basophils Absolute: 0 10*3/uL (ref 0.0–0.1)
Basophils Relative: 0 % (ref 0–1)
HCT: 40.2 % (ref 36.0–46.0)
Hemoglobin: 13.4 g/dL (ref 12.0–15.0)
Lymphocytes Relative: 23 % (ref 12–46)
MCHC: 33.3 g/dL (ref 30.0–36.0)
Monocytes Absolute: 0.6 10*3/uL (ref 0.1–1.0)
Neutro Abs: 6 10*3/uL (ref 1.7–7.7)
Neutrophils Relative %: 66 % (ref 43–77)
RDW: 13.1 % (ref 11.5–15.5)
WBC: 9.1 10*3/uL (ref 4.0–10.5)

## 2011-11-12 LAB — COMPREHENSIVE METABOLIC PANEL
ALT: 10 U/L (ref 0–35)
AST: 14 U/L (ref 0–37)
Albumin: 3.9 g/dL (ref 3.5–5.2)
Alkaline Phosphatase: 52 U/L (ref 39–117)
Calcium: 9.3 mg/dL (ref 8.4–10.5)
Chloride: 103 mEq/L (ref 96–112)
Potassium: 4.5 mEq/L (ref 3.5–5.3)
Sodium: 138 mEq/L (ref 135–145)
Total Protein: 6.4 g/dL (ref 6.0–8.3)

## 2011-11-12 MED ORDER — FLUOXETINE HCL 40 MG PO CAPS: 40.0000 mg | ORAL_CAPSULE | Freq: Every day | ORAL | Status: DC

## 2011-11-12 NOTE — Telephone Encounter (Signed)
E-rxd

## 2011-11-12 NOTE — Progress Notes (Signed)
Chief Complaint: RUQ pain, ? knot/swollen. Nausea and vomiting end of last week.  Hasn't eaten in 3 days  HPI:  Symptoms began 3 nights ago, awoke from sleep with sharp pain in RUQ. Then started vomiting.  Pain radiated around to the R back, but just for the first day. Back pain resolved, ongoing RUQ discomfort, and she noted a "knot"/swelling along the ribs.  Painful to wear her bra. Vomited 3 times 3 days ago, none since.  She hasn't eaten since then, but has now been tolerating liquids, and keeping them down.  Today she woke up hungry for the first time.  Denies diarrhea.  Has had a small bowel movement every day, firm/hard initially, but better since starting stool softeners.  No blood in stool, passing gas well.  Has been having some heartburn because she threw up her medication (Nexium).  Able to keep meds down yesterday.  Currently denies nausea.  Never had any fevers. Denies cough/URI symptoms.  Denies urinary symptoms.  She has chronic pain in epigastrium since her hernia surgery.  Past Medical History  Diagnosis Date  . Allergy   . Asthma   . PCOS (polycystic ovarian syndrome)   . Cancer     VAGINAL/VULVAR  . Uterine fibroid   . Hyperlipidemia   . Vitamin D deficiency   . Fibromyalgia   . Anemia   . Migraine   . GERD (gastroesophageal reflux disease)   . HSV-2 (herpes simplex virus 2) infection     PER BLOOD TEST  . ADD (attention deficit hyperactivity disorder, inattentive type)   . Depression   . PVC's (premature ventricular contractions)     DR.LITTLE    Past Surgical History  Procedure Date  . Squamous cell carcinoma excision 2010+2011    VULVAR CA EXCISION X 2 (DR HOLLAND)  . Sigmoidectomy 1997    DIVERTICULITS  . Colonoscopy 2000/2008  . Hernia repair 09/2010    VENTRAL  . Appendectomy     History   Social History  . Marital Status: Single    Spouse Name: N/A    Number of Children: N/A  . Years of Education: N/A   Occupational History  . Not on file.     Social History Main Topics  . Smoking status: Never Smoker   . Smokeless tobacco: Never Used  . Alcohol Use: Yes     social,1-2 drinks every other week  . Drug Use: No  . Sexually Active: Not Currently    Birth Control/ Protection: Pill   Other Topics Concern  . Not on file   Social History Narrative  . No narrative on file    Family History  Problem Relation Age of Onset  . Hypertension Mother   . Fibromyalgia Mother   . Heart attack Father   . Sarcoidosis Father   . ADD / ADHD Sister   . Asthma Brother   . ADD / ADHD Brother   . Cancer Maternal Aunt 40    breast  . Cancer Maternal Aunt 40    breast   Current Outpatient Prescriptions on File Prior to Visit  Medication Sig Dispense Refill  . albuterol (PROVENTIL HFA;VENTOLIN HFA) 108 (90 BASE) MCG/ACT inhaler Inhale 2 puffs into the lungs every 6 (six) hours as needed for wheezing.  48 g  0  . amphetamine-dextroamphetamine (ADDERALL XR) 30 MG 24 hr capsule Take 1 capsule (30 mg total) by mouth every morning.  90 capsule  0  . cholecalciferol (VITAMIN D) 1000 UNITS tablet  Take 1,000 Units by mouth daily.        Marland Kitchen esomeprazole (NEXIUM) 40 MG capsule Take 1 capsule (40 mg total) by mouth daily before breakfast.  90 capsule  3  . FLUoxetine (PROZAC) 40 MG capsule Take 1 capsule (40 mg total) by mouth daily.  90 capsule  0  . Iron-Folic Acid-B12-C-Docusate (FERRAPLUS 90 PO) Take 2 tablets by mouth daily.        Kathrynn Running Estrad-Fe Biphas (LO LOESTRIN FE) 1 MG-10 MCG / 10 MCG TABS Take by mouth daily.        . TOPROL XL 50 MG 24 hr tablet Take 1 tablet (50 mg total) by mouth daily.  90 tablet  3  . valACYclovir (VALTREX) 1000 MG tablet Take 1 tablet (1,000 mg total) by mouth daily.  90 tablet  3  . amphetamine-dextroamphetamine (ADDERALL) 5 MG tablet Take 1 tablet (5 mg total) by mouth daily.  90 tablet  0    Allergies  Allergen Reactions  . Codeine   . Latex    ROS:  See HPI.  Denies fevers, urinary  complaints.  PHYSICAL EXAM: BP 128/76  Pulse 84  Ht 5\' 5"  (1.651 m)  Wt 217 lb (98.431 kg)  BMI 36.11 kg/m2 Well developed, pleasant female, in no acute distress Heart: regular rate and rhythm Lungs: clear bilaterally Abdomen: tender in epigastrium (chronic, per pt), and tender along RUQ to mid-abdomen.  +rebound especially at upper portion of RUQ.  No guarding.  Normal bowel sounds.  WHSS midline Skin: no rash Psych: normal mood, affect, hygiene and grooming  ASSESSMENT/PLAN: 1. RUQ abdominal pain  CBC with Differential, Comprehensive metabolic panel, Amylase, Lipase, DG Abd Acute W/Chest, US Abdomen Limited RUQ   Pt with h/o abdominal surgeries and bowel obstructions.  Clearly doesn't have bowel obstruction, but check AAS to r/o partial SBO. At risk for pSBO, but sounds like her nausea has improved, and having BM's.  Will check acute abdominal series. If okay, then schedule RUQ u/s to evaluate her gall bladder.  May have MSK component (pain with deep breath, superficial swelling over ribs per history).  Warm compresses and NSAIDs  CBC, c-met, lipase and amylase.  Acute abdominal series.  If normal then RUQ u/s.  Possibly has MSK component.  Continue heating pad.  If studies all normal, can try NSAIDs

## 2011-11-12 NOTE — Patient Instructions (Signed)
Continue clear liquids. Slowly advance diet as tolerated (if x-rays normal). Check x-rays today. If no obstruction, then will also check ultrasound. If all tests normal, consider musculoskeletal component.  Anti-inflammatories (ie Motrin or Aleve) and heating pad will help.

## 2011-11-13 ENCOUNTER — Other Ambulatory Visit: Payer: Self-pay | Admitting: Family Medicine

## 2011-11-13 ENCOUNTER — Ambulatory Visit (HOSPITAL_BASED_OUTPATIENT_CLINIC_OR_DEPARTMENT_OTHER)
Admission: RE | Admit: 2011-11-13 | Discharge: 2011-11-13 | Disposition: A | Discharge status: Home / Self Care | Payer: 59 | Source: Ambulatory Visit | Attending: Family Medicine | Admitting: Family Medicine

## 2011-11-13 DIAGNOSIS — R1011 Right upper quadrant pain: Secondary | ICD-10-CM

## 2011-11-13 DIAGNOSIS — R112 Nausea with vomiting, unspecified: Secondary | ICD-10-CM | POA: Insufficient documentation

## 2011-11-14 ENCOUNTER — Telehealth: Payer: Self-pay | Admitting: *Deleted

## 2011-11-14 MED ORDER — HYDROCODONE-ACETAMINOPHEN 5-500 MG PO TABS: 1.0000 | ORAL_TABLET | Freq: Four times a day (QID) | ORAL | Status: DC | PRN

## 2011-11-14 NOTE — Telephone Encounter (Signed)
Okay for #20 vicodin

## 2011-11-14 NOTE — Telephone Encounter (Signed)
Patient is scheduled with Dr.Gerkin @ [facility] for 12/10/11 @ 2:15pm. I spoke with Robin, the triage nurse there and she said that was the absolute soonest they could see her. She did tell me to instruct the patient to call her if the pain or fever gets "unbearable," and they may be able to get her in earlier. Patient wants to know if you will call her in something for the pain? [facility]. Thanks.

## 2011-11-15 ENCOUNTER — Telehealth: Payer: Self-pay | Admitting: *Deleted

## 2011-11-15 ENCOUNTER — Encounter: Payer: Self-pay | Admitting: *Deleted

## 2011-11-15 MED ORDER — TRAMADOL HCL 50 MG PO TABS: 50.0000 mg | ORAL_TABLET | Freq: Four times a day (QID) | ORAL | Status: DC | PRN

## 2011-11-15 NOTE — Telephone Encounter (Signed)
Spoke with patient and she said that she has had a long term allergy to codeine(itching,hives,rash) and then when she had her last surgery she also reacted to hydrocodone with the same reaction of itching, hives and rash. She remembers that it was Vicodin given to her in the past that caused a reaction. She stated that they had to change her to Dilaudid, which she did not have a reaction on. I also asked her she had any reactions to Ultram in the past, she stated that she did not.

## 2011-11-15 NOTE — Telephone Encounter (Signed)
Tramadol sent to pharmacy.  Please advise pt. Also please update allergies in chart

## 2011-11-15 NOTE — Telephone Encounter (Signed)
[facility] called and patient was there to pick up rx that was called in last eve for Vicodin, patient is allergic to codeine. Can you change? Thanks.

## 2011-11-15 NOTE — Telephone Encounter (Signed)
Left message for patient that new rx for Tramadol sent to pharmacy. Also updates allergies in chart.

## 2011-11-15 NOTE — Telephone Encounter (Signed)
We don't have documented what the allergic reaction was to codeine. If severe (ie anaphylaxis), then change to tramadol 50mg .  Ask pt if she has had hydrocodone in the past, and if tolerated. If so, then tell pharmacy okay to fill. Or if only n/v to codeine, okay to fill. Also, ask Brenda Little how she's feeling--ongoing fevers? If ongoing fevers, worsening abdominal pain and surgeons won't see her in office, may need to go to ER

## 2011-11-19 ENCOUNTER — Emergency Department (INDEPENDENT_AMBULATORY_CARE_PROVIDER_SITE_OTHER): Payer: 59

## 2011-11-19 ENCOUNTER — Emergency Department (HOSPITAL_BASED_OUTPATIENT_CLINIC_OR_DEPARTMENT_OTHER)
Admission: EM | Admit: 2011-11-19 | Discharge: 2011-11-19 | Disposition: A | Discharge status: Home / Self Care | Payer: 59 | Attending: Emergency Medicine | Admitting: Emergency Medicine

## 2011-11-19 ENCOUNTER — Encounter (HOSPITAL_BASED_OUTPATIENT_CLINIC_OR_DEPARTMENT_OTHER): Payer: Self-pay | Admitting: *Deleted

## 2011-11-19 DIAGNOSIS — Z79899 Other long term (current) drug therapy: Secondary | ICD-10-CM | POA: Insufficient documentation

## 2011-11-19 DIAGNOSIS — R112 Nausea with vomiting, unspecified: Secondary | ICD-10-CM | POA: Insufficient documentation

## 2011-11-19 DIAGNOSIS — R109 Unspecified abdominal pain: Secondary | ICD-10-CM

## 2011-11-19 DIAGNOSIS — R509 Fever, unspecified: Secondary | ICD-10-CM

## 2011-11-19 DIAGNOSIS — K8020 Calculus of gallbladder without cholecystitis without obstruction: Secondary | ICD-10-CM

## 2011-11-19 DIAGNOSIS — K219 Gastro-esophageal reflux disease without esophagitis: Secondary | ICD-10-CM | POA: Insufficient documentation

## 2011-11-19 DIAGNOSIS — R1011 Right upper quadrant pain: Secondary | ICD-10-CM

## 2011-11-19 DIAGNOSIS — IMO0001 Reserved for inherently not codable concepts without codable children: Secondary | ICD-10-CM | POA: Insufficient documentation

## 2011-11-19 DIAGNOSIS — J45909 Unspecified asthma, uncomplicated: Secondary | ICD-10-CM | POA: Insufficient documentation

## 2011-11-19 DIAGNOSIS — E785 Hyperlipidemia, unspecified: Secondary | ICD-10-CM | POA: Insufficient documentation

## 2011-11-19 LAB — DIFFERENTIAL
Basophils Relative: 0 % (ref 0–1)
Eosinophils Absolute: 0.3 10*3/uL (ref 0.0–0.7)
Eosinophils Relative: 4 % (ref 0–5)
Lymphs Abs: 0.6 10*3/uL — ABNORMAL LOW (ref 0.7–4.0)
Monocytes Absolute: 0.6 10*3/uL (ref 0.1–1.0)
Monocytes Relative: 8 % (ref 3–12)

## 2011-11-19 LAB — BASIC METABOLIC PANEL
BUN: 11 mg/dL (ref 6–23)
Creatinine, Ser: 1 mg/dL (ref 0.50–1.10)
GFR calc Af Amer: 81 mL/min — ABNORMAL LOW (ref >90)
GFR calc non Af Amer: 70 mL/min — ABNORMAL LOW (ref >90)
Glucose, Bld: 92 mg/dL (ref 70–99)

## 2011-11-19 LAB — HEPATIC FUNCTION PANEL
ALT: 15 U/L (ref 0–35)
Albumin: 3.6 g/dL (ref 3.5–5.2)
Alkaline Phosphatase: 70 U/L (ref 39–117)
Total Bilirubin: 0.2 mg/dL — ABNORMAL LOW (ref 0.3–1.2)

## 2011-11-19 LAB — CBC
HCT: 39.9 % (ref 36.0–46.0)
Hemoglobin: 13.4 g/dL (ref 12.0–15.0)
MCH: 29.4 pg (ref 26.0–34.0)
MCHC: 33.6 g/dL (ref 30.0–36.0)
MCV: 87.5 fL (ref 78.0–100.0)

## 2011-11-19 LAB — URINALYSIS, ROUTINE W REFLEX MICROSCOPIC
Bilirubin Urine: NEGATIVE
Glucose, UA: NEGATIVE mg/dL
Hgb urine dipstick: NEGATIVE
Protein, ur: NEGATIVE mg/dL
Urobilinogen, UA: 1 mg/dL (ref 0.0–1.0)

## 2011-11-19 MED ORDER — HYDROMORPHONE HCL PF 1 MG/ML IJ SOLN
0.5000 mg | Freq: Once | INTRAMUSCULAR | Status: AC
  Administered 2011-11-19: 1 mg via INTRAVENOUS

## 2011-11-19 MED ORDER — HYDROMORPHONE HCL PF 1 MG/ML IJ SOLN
INTRAMUSCULAR | Status: AC
  Administered 2011-11-19: 1 mg via INTRAVENOUS
  Filled 2011-11-19: qty 1

## 2011-11-19 MED ORDER — HYDROMORPHONE HCL PF 1 MG/ML IJ SOLN
1.0000 mg | Freq: Once | INTRAMUSCULAR | Status: AC
  Administered 2011-11-19: 1 mg via INTRAVENOUS

## 2011-11-19 MED ORDER — ONDANSETRON HCL 4 MG/2ML IJ SOLN
INTRAMUSCULAR | Status: AC
  Administered 2011-11-19: 4 mg via INTRAVENOUS
  Filled 2011-11-19: qty 2

## 2011-11-19 MED ORDER — ONDANSETRON HCL 4 MG/2ML IJ SOLN
4.0000 mg | Freq: Once | INTRAMUSCULAR | Status: AC
  Administered 2011-11-19: 4 mg via INTRAVENOUS
  Filled 2011-11-19: qty 2

## 2011-11-19 MED ORDER — SODIUM CHLORIDE 0.9 % IV BOLUS (SEPSIS)
1000.0000 mL | Freq: Once | INTRAVENOUS | Status: AC
  Administered 2011-11-19: 1000 mL via INTRAVENOUS

## 2011-11-19 MED ORDER — ACETAMINOPHEN 500 MG PO TABS
ORAL_TABLET | ORAL | Status: AC
  Administered 2011-11-19: 1000 mg via ORAL
  Filled 2011-11-19: qty 2

## 2011-11-19 MED ORDER — ONDANSETRON HCL 4 MG PO TABS: 4.0000 mg | ORAL_TABLET | Freq: Three times a day (TID) | ORAL | Status: AC | PRN

## 2011-11-19 MED ORDER — ACETAMINOPHEN 500 MG PO TABS
1000.0000 mg | ORAL_TABLET | Freq: Once | ORAL | Status: AC
  Administered 2011-11-19: 1000 mg via ORAL

## 2011-11-19 MED ORDER — ONDANSETRON HCL 4 MG/2ML IJ SOLN
4.0000 mg | Freq: Once | INTRAMUSCULAR | Status: AC
  Administered 2011-11-19: 4 mg via INTRAVENOUS

## 2011-11-19 MED ORDER — OXYCODONE-ACETAMINOPHEN 5-325 MG PO TABS: 1.0000 | ORAL_TABLET | ORAL | Status: AC | PRN

## 2011-11-19 NOTE — ED Provider Notes (Signed)
History     CSN: [id number]  Arrival date & time 11/19/11  2001   First MD Initiated Contact with Patient 11/19/11 2029      Chief Complaint  Patient presents with  . Fever    (Consider location/radiation/quality/duration/timing/severity/associated sxs/prior treatment) Patient is a 40 y.o. female presenting with fever. The history is provided by the patient.  Fever Primary symptoms of the febrile illness include fever, abdominal pain, nausea and vomiting. Primary symptoms do not include diarrhea. The current episode started today. Chronicity: She was recently diagnosed with gall stones and has outpatient surgical consult later this month. Today with cough, increased RUQ pain and fever.    Past Medical History  Diagnosis Date  . Allergy   . Asthma   . PCOS (polycystic ovarian syndrome)   . Cancer     VAGINAL/VULVAR  . Uterine fibroid   . Hyperlipidemia   . Vitamin D deficiency   . Fibromyalgia   . Anemia   . Migraine   . GERD (gastroesophageal reflux disease)   . HSV-2 (herpes simplex virus 2) infection     PER BLOOD TEST  . ADD (attention deficit hyperactivity disorder, inattentive type)   . Depression   . PVC's (premature ventricular contractions)     DR.LITTLE  . Diverticulitis     Past Surgical History  Procedure Date  . Squamous cell carcinoma excision 2010+2011    VULVAR CA EXCISION X 2 (DR HOLLAND)  . Sigmoidectomy 1997    DIVERTICULITS  . Colonoscopy 2000/2008  . Hernia repair 09/2010    VENTRAL  . Appendectomy     Family History  Problem Relation Age of Onset  . Hypertension Mother   . Fibromyalgia Mother   . Heart attack Father   . Sarcoidosis Father   . ADD / ADHD Sister   . Asthma Brother   . ADD / ADHD Brother   . Cancer Maternal Aunt 40    breast  . Cancer Maternal Aunt 40    breast    History  Substance Use Topics  . Smoking status: Never Smoker   . Smokeless tobacco: Never Used  . Alcohol Use: Yes     social,1-2 drinks every  other week    OB History    Grav Para Term Preterm Abortions TAB SAB Ect Mult Living                  Review of Systems  Constitutional: Positive for fever. Negative for chills.  HENT: Negative.   Respiratory: Negative.   Cardiovascular: Negative.   Gastrointestinal: Positive for nausea, vomiting and abdominal pain. Negative for diarrhea.  Musculoskeletal: Negative.   Skin: Negative.   Neurological: Negative.     Allergies  Latex; Codeine; and Hydrocodone  Home Medications   Current Outpatient Rx  Name Route Sig Dispense Refill  . ALBUTEROL SULFATE HFA 108 (90 BASE) MCG/ACT IN AERS Inhalation Inhale 2 puffs into the lungs every 6 (six) hours as needed for wheezing. 48 g 0  . AMPHETAMINE-DEXTROAMPHET ER 30 MG PO CP24 Oral Take 1 capsule (30 mg total) by mouth every morning. 90 capsule 0  . VITAMIN D 1000 UNITS PO TABS Oral Take 1,000 Units by mouth daily.      Marland Kitchen ESOMEPRAZOLE MAGNESIUM 40 MG PO CPDR Oral Take 1 capsule (40 mg total) by mouth daily before breakfast. 90 capsule 3  . FLUOXETINE HCL 40 MG PO CAPS Oral Take 1 capsule (40 mg total) by mouth daily. 90 capsule 1  .  NORETHIN-ETH ESTRAD-FE BIPHAS 1 MG-10 MCG / 10 MCG PO TABS Oral Take by mouth daily.      Marland Kitchen PSEUDOEPH-DOXYLAMINE-DM-APAP 60-7.04-24-999 MG/30ML PO LIQD Oral Take 30 mLs by mouth at bedtime.      . TOPROL XL 50 MG PO TB24 Oral Take 1 tablet (50 mg total) by mouth daily. 90 tablet 3    Dispense as written.  . TRAMADOL HCL 50 MG PO TABS Oral Take 1 tablet (50 mg total) by mouth every 6 (six) hours as needed for pain. Maximum dose= 8 tablets per day 30 tablet 0  . AMPHETAMINE-DEXTROAMPHETAMINE 5 MG PO TABS Oral Take 1 tablet (5 mg total) by mouth daily. 90 tablet 0    BP 133/72  Pulse 94  Temp(Src) 99 F (37.2 C) (Oral)  Resp 18  SpO2 97%  Physical Exam  Constitutional: She appears well-developed and well-nourished.  HENT:  Head: Normocephalic.  Neck: Normal range of motion. Neck supple.    Cardiovascular: Normal rate and regular rhythm.   Pulmonary/Chest: Effort normal and breath sounds normal. She has no wheezes. She has no rales.  Abdominal: Soft. Bowel sounds are normal. She exhibits no mass. There is tenderness. There is no rebound and no guarding.    Musculoskeletal: Normal range of motion.  Neurological: She is alert. No cranial nerve deficit.  Skin: Skin is warm and dry. No rash noted.  Psychiatric: She has a normal mood and affect.    ED Course  Procedures (including critical care time)  Labs Reviewed  DIFFERENTIAL - Abnormal; Notable for the following:    Neutrophils Relative 80 (*)    Lymphocytes Relative 8 (*)    Lymphs Abs 0.6 (*)    All other components within normal limits  BASIC METABOLIC PANEL - Abnormal; Notable for the following:    Sodium 133 (*)    GFR calc non Af Amer 70 (*)    GFR calc Af Amer 81 (*)    All other components within normal limits  HEPATIC FUNCTION PANEL - Abnormal; Notable for the following:    Total Bilirubin 0.2 (*)    All other components within normal limits  CBC  LIPASE, BLOOD  URINALYSIS, ROUTINE W REFLEX MICROSCOPIC   US Abdomen Complete  11/19/2011  *RADIOLOGY REPORT*  Clinical Data:  Right upper quadrant abdominal pain and fever. Worsening postprandial pain; recently diagnosed with gallstones.  ABDOMINAL ULTRASOUND COMPLETE  Comparison:  Abdominal ultrasound performed 11/13/2011  Findings:  Gallbladder:  The gallbladder is unchanged in appearance.  A 7 mm stone is again noted adherent to the fundus of the gallbladder. There is mild gallbladder wall thickening, measuring 3 mm, with a positive ultrasonographic Murphy's sign, as on the prior study. This may reflect mild cholecystitis, as previously noted.  There is no evidence for obstruction.  Common Bile Duct:  0.4 cm in diameter; within normal limits in caliber.  Liver:  Normal parenchymal echogenicity and echotexture; no focal lesions identified.  Limited Doppler  evaluation demonstrates normal blood flow within the liver.  IVC:  Unremarkable in appearance.  Pancreas:  Not well seen due to overlying bowel gas.  Spleen:  12.6 cm cm in length; borderline normal in size, with normal echotexture.  Right kidney:  10.0 cm in length; normal in size, configuration and parenchymal echogenicity.  No evidence of mass or hydronephrosis.  Left kidney:  10.3 cm in length; normal in size, configuration and parenchymal echogenicity.  No evidence of mass or hydronephrosis.  Abdominal Aorta:  Normal in  caliber; no aneurysm identified. Difficult to fully characterize due to overlying bowel gas.  IMPRESSION: No significant change from the recent prior ultrasound performed 11/13/2011.  A 7 mm stone is again seen inferior to the fundus of the gallbladder, and there is mild apparent gallbladder wall thickening, with a positive ultrasonographic Murphy's sign, also noted on the prior study.  This may reflect mild cholecystitis, stable from the recent prior study.  Original Report Authenticated By: Tonia Ghent, M.D.   Dg Abd Acute W/chest  11/19/2011  *RADIOLOGY REPORT*  Clinical Data: Nominal pain.  Fever.  ACUTE ABDOMEN SERIES (ABDOMEN 2 VIEW & CHEST 1 VIEW)  Comparison: None.  Findings:  Cardiopericardial silhouette within normal limits. Mediastinal contours normal. Trachea midline.  No airspace disease or effusion.  No free air is present underneath the hemidiaphragms. The bowel gas pattern appears normal.  No calcified gallstones are visualized.  Calcified phleboliths are present in the anatomic pelvis.  IMPRESSION: No acute abnormality.  Original Report Authenticated By: Andreas Newport, M.D.     No diagnosis found.    MDM  Pain controlled in ED. Repeat US negative for worsening inflammation, no leukocytosis. X-ray negative for PNA. She is feeling better and tolerating PO fluids. No further nausea.         Rodena Medin, PA 11/19/11 [phone]

## 2011-11-19 NOTE — ED Notes (Signed)
Pt in radiology at this time. 

## 2011-11-19 NOTE — ED Notes (Signed)
Fever today. Right upper quad pain. Vomiting. She is suppose to have her gallbladder removed January 14th.

## 2011-11-19 NOTE — ED Provider Notes (Signed)
Medical screening examination/treatment/procedure(s) were performed by non-physician practitioner and as supervising physician I was immediately available for consultation/collaboration.  Deundra Furber, MD 11/19/11 2350 

## 2011-11-19 NOTE — ED Notes (Signed)
Pt tolerated small sips of water. Feels well enough for discharge

## 2011-11-19 NOTE — ED Notes (Signed)
C/o dry cough since last night. Has had intermittent RUQ pains since last week-dx gallbladder disease. Pain worsens after eating. Multiple episodes of vomiting today. Unable to get an appt until next month to see the surgeon. Has been taking tramadol for pain.

## 2011-11-19 NOTE — ED Notes (Signed)
Pt was able to urinate, but states she missed the cup and wasn't able to provide a specimen. PA aware. Pt instructed to notify staff when able to urinate again.

## 2011-11-19 NOTE — ED Notes (Signed)
Shari PA at bedside speaking to patient about test results

## 2011-12-05 ENCOUNTER — Other Ambulatory Visit (INDEPENDENT_AMBULATORY_CARE_PROVIDER_SITE_OTHER): Payer: Self-pay | Admitting: General Surgery

## 2011-12-07 ENCOUNTER — Ambulatory Visit (INDEPENDENT_AMBULATORY_CARE_PROVIDER_SITE_OTHER): Payer: Commercial Managed Care - PPO | Admitting: General Surgery

## 2011-12-07 ENCOUNTER — Encounter (INDEPENDENT_AMBULATORY_CARE_PROVIDER_SITE_OTHER): Payer: Self-pay | Admitting: General Surgery

## 2011-12-07 VITALS — BP 126/80 | HR 59 | Temp 97.9°F | Ht 64.0 in | Wt 214.8 lb

## 2011-12-07 DIAGNOSIS — K8020 Calculus of gallbladder without cholecystitis without obstruction: Secondary | ICD-10-CM

## 2011-12-07 NOTE — Progress Notes (Signed)
Patient ID: Brenda Little, female   DOB: [birth year], 41 y.o.   MRN: [id number]  Chief Complaint  Patient presents with  . Pre-op Exam    eval gallstones    HPI Brenda Little is a 41 y.o. female.  This patient is referred by Dr. Lynelle Doctor for evaluation of right upper quadrant abdominal pain and gallstones. She has had right upper quadrant discomfort and pain which she describes as "constant" pain with associated nausea and vomiting and the pain radiates down her side. She states it is worse with eating and recently tried eating ice cream and this caused her to vomit for 20 minutes. She has a history of diverticulosis and diverticulitis and was that she felt that her symptoms were due to the diverticulosis. She was seen twice in the emergency room in December for evaluation which demonstrated gallstones on ultrasound but liver function tests were normal. At that time she had fevers of up to 103. She has also had associated hiccuping and burping she has a history of reflux but this is well-controlled with her Nexium. She states that her bowels are irregular but they are normally constipated. HPI  Past Medical History  Diagnosis Date  . Allergy   . Asthma   . PCOS (polycystic ovarian syndrome)   . Cancer     VAGINAL/VULVAR  . Uterine fibroid   . Hyperlipidemia   . Vitamin D deficiency   . Fibromyalgia   . Anemia   . Migraine   . GERD (gastroesophageal reflux disease)   . HSV-2 (herpes simplex virus 2) infection     PER BLOOD TEST  . ADD (attention deficit hyperactivity disorder, inattentive type)   . Depression   . PVC's (premature ventricular contractions)     DR.LITTLE  . Diverticulitis   . Abdominal  pain, other specified site   . Constipation   . Nausea and vomiting     Past Surgical History  Procedure Date  . Squamous cell carcinoma excision 2010+2011    VULVAR CA EXCISION X 2 (DR HOLLAND)  . Sigmoidectomy 1997    DIVERTICULITS  . Colonoscopy 2000/2008  . Hernia repair 09/2010      VENTRAL  . Appendectomy   . Colon surgery     Family History  Problem Relation Age of Onset  . Hypertension Mother   . Fibromyalgia Mother   . Heart attack Father   . Sarcoidosis Father   . ADD / ADHD Sister   . Asthma Brother   . ADD / ADHD Brother   . Cancer Maternal Aunt 40    breast  . Cancer Maternal Aunt 40    breast  . Cancer Paternal Grandmother     uterine cancer    Social History History  Substance Use Topics  . Smoking status: Never Smoker   . Smokeless tobacco: Never Used  . Alcohol Use: Yes     social,1-2 drinks every other week    Allergies  Allergen Reactions  . Latex     blisters  . Codeine Hives, Itching and Rash  . Hydrocodone Hives, Itching and Rash    Current Outpatient Prescriptions  Medication Sig Dispense Refill  . albuterol (PROVENTIL HFA;VENTOLIN HFA) 108 (90 BASE) MCG/ACT inhaler Inhale 2 puffs into the lungs every 6 (six) hours as needed for wheezing.  48 g  0  . amphetamine-dextroamphetamine (ADDERALL XR) 30 MG 24 hr capsule Take 1 capsule (30 mg total) by mouth every morning.  90 capsule  0  .  cholecalciferol (VITAMIN D) 1000 UNITS tablet Take 1,000 Units by mouth daily.        Marland Kitchen esomeprazole (NEXIUM) 40 MG capsule Take 1 capsule (40 mg total) by mouth daily before breakfast.  90 capsule  3  . FLUoxetine (PROZAC) 40 MG capsule Take 1 capsule (40 mg total) by mouth daily.  90 capsule  1  . Norethin-Eth Estrad-Fe Biphas (LO LOESTRIN FE) 1 MG-10 MCG / 10 MCG TABS Take by mouth daily.        . Pseudoeph-Doxylamine-DM-APAP (NYQUIL) 60-7.04-24-999 MG/30ML LIQD Take 30 mLs by mouth at bedtime.        . TOPROL XL 50 MG 24 hr tablet Take 1 tablet (50 mg total) by mouth daily.  90 tablet  3  . traMADol (ULTRAM) 50 MG tablet Take 1 tablet (50 mg total) by mouth every 6 (six) hours as needed for pain. Maximum dose= 8 tablets per day  30 tablet  0  . amphetamine-dextroamphetamine (ADDERALL) 5 MG tablet Take 1 tablet (5 mg total) by mouth daily.   90 tablet  0    Review of Systems Review of Systems All other review of systems negative or noncontributory except as stated in the HPI  Blood pressure 126/80, pulse 59, temperature 97.9 F (36.6 C), temperature source Temporal, height 5\' 4"  (1.626 m), weight 214 lb 12.8 oz (97.433 kg), SpO2 98.00%.  Physical Exam Physical Exam  Vitals reviewed. Constitutional: She is oriented to person, place, and time. She appears well-developed and well-nourished. No distress.  HENT:  Head: Normocephalic and atraumatic.  Mouth/Throat: No oropharyngeal exudate.  Eyes: Conjunctivae are normal. Pupils are equal, round, and reactive to light. Right eye exhibits no discharge. Left eye exhibits no discharge. No scleral icterus.  Neck: Normal range of motion. Neck supple. No tracheal deviation present.  Cardiovascular: Normal rate, regular rhythm and normal heart sounds.   Pulmonary/Chest: Effort normal and breath sounds normal. No stridor. No respiratory distress. She has no wheezes. She has no rales. She exhibits no tenderness.  Abdominal: Soft. Bowel sounds are normal. She exhibits no distension and no mass. There is tenderness. There is no rebound and no guarding.       Mild ruq tenderness, lower midline scar, no hernias appreciated.  Musculoskeletal: Normal range of motion. She exhibits no edema and no tenderness.  Neurological: She is alert and oriented to person, place, and time. No cranial nerve deficit.  Skin: Skin is warm and dry. No rash noted. She is not diaphoretic. No erythema. No pallor.  Psychiatric: She has a normal mood and affect. Her behavior is normal. Judgment and thought content normal.    Data Reviewed [place], labs   Assessment    Symptomatic cholelithiasis I think that her symptoms certainly could be due to symptomatic cholelithiasis. She has no quadrant pain after eating and gallstones on ultrasound and I am asked to concern which she may have had cholecystitis as well given her  fevers while in the emergency room. I discussed with her the options of cholecystectomy for treatment of her symptoms she is interested in proceeding with surgery. We talked about the alternatives of continued observation versus surgery and the risks of each and we discussed the risks of infection, bleeding, pain, scarring, persistent symptoms, injury to bowel or bile ducts, diarrhea, and need for open surgery and she expressed understanding and desires to proceed with cholecystectomy.    Plan    We will set her up for cholecystectomy when available.  Lodema Pilot DAVID 12/07/2011, 12:20 PM

## 2011-12-10 ENCOUNTER — Other Ambulatory Visit (INDEPENDENT_AMBULATORY_CARE_PROVIDER_SITE_OTHER): Payer: Self-pay | Admitting: Surgery

## 2011-12-12 ENCOUNTER — Encounter (HOSPITAL_COMMUNITY): Payer: Self-pay

## 2011-12-13 ENCOUNTER — Telehealth (INDEPENDENT_AMBULATORY_CARE_PROVIDER_SITE_OTHER): Payer: Self-pay

## 2011-12-13 NOTE — Telephone Encounter (Signed)
Left voice message for patient to call our office, patient called requesting medication for pain and nausea.  Dr. Biagio Quint was paged and reviewed patient request, he approved a prescription for Hydrocodone 5/500, 1 po  q6 hrs, prn pain, #20 no refills.  Awaiting Ms. Mitchell's call to see where she would like her medication called in, surgery scheduling reviewing to possibly move Lap Chole w/IOC to 12/19/11 per Dr. Delice Lesch request.

## 2011-12-18 ENCOUNTER — Other Ambulatory Visit (INDEPENDENT_AMBULATORY_CARE_PROVIDER_SITE_OTHER): Payer: Self-pay

## 2011-12-18 MED ORDER — HYDROCODONE-ACETAMINOPHEN 5-500 MG PO TABS: 1.0000 | ORAL_TABLET | Freq: Four times a day (QID) | ORAL | Status: AC | PRN

## 2011-12-19 ENCOUNTER — Encounter (HOSPITAL_COMMUNITY): Payer: Self-pay

## 2011-12-19 ENCOUNTER — Other Ambulatory Visit: Payer: Self-pay

## 2011-12-19 ENCOUNTER — Encounter (HOSPITAL_COMMUNITY)
Admission: RE | Admit: 2011-12-19 | Discharge: 2011-12-19 | Disposition: A | Discharge status: Home / Self Care | Payer: 59 | Source: Ambulatory Visit | Attending: General Surgery | Admitting: General Surgery

## 2011-12-19 DIAGNOSIS — K8020 Calculus of gallbladder without cholecystitis without obstruction: Secondary | ICD-10-CM

## 2011-12-19 HISTORY — DX: Pneumonia, unspecified organism: J18.9

## 2011-12-19 HISTORY — DX: Personal history of other diseases of the digestive system: Z87.19

## 2011-12-19 HISTORY — DX: Bronchitis, not specified as acute or chronic: J40

## 2011-12-19 LAB — SURGICAL PCR SCREEN
MRSA, PCR: NEGATIVE
Staphylococcus aureus: NEGATIVE

## 2011-12-19 LAB — CBC
HCT: 40.7 % (ref 36.0–46.0)
MCV: 88.9 fL (ref 78.0–100.0)
Platelets: 295 10*3/uL (ref 150–400)
RBC: 4.58 MIL/uL (ref 3.87–5.11)
RDW: 13.3 % (ref 11.5–15.5)
WBC: 10.3 10*3/uL (ref 4.0–10.5)

## 2011-12-19 NOTE — Pre-Procedure Instructions (Signed)
Brenda Little  12/19/2011   Your procedure is scheduled on:  Tuesday December 25, 2011  Report to [facility] at 0530 AM.  Call this number if you have problems the morning of surgery: [phone]   Remember:   Do not eat food:After Midnight.  May have clear liquids: up to 4 Hours before arrival. (up to 1:30am)  Clear liquids include soda, tea, black coffee, apple or grape juice, broth.  Take these medicines the morning of surgery with A SIP OF WATER: albuterol, adderall, nexium, prozac, vicodin, toprol XL, Loestrin, ultram   Do not wear jewelry, make-up or nail polish.  Do not wear lotions, powders, or perfumes. You may wear deodorant.  Do not shave 48 hours prior to surgery.  Do not bring valuables to the hospital.  Contacts, dentures or bridgework may not be worn into surgery.  Leave suitcase in the car. After surgery it may be brought to your room.  For patients admitted to the hospital, checkout time is 11:00 AM the day of discharge.   Patients discharged the day of surgery will not be allowed to drive home.  Name and phone number of your driver: Jenella Craigie [phone]  Special Instructions: CHG Shower Use Special Wash: 1/2 bottle night before surgery and 1/2 bottle morning of surgery.   Please read over the following fact sheets that you were given: Pain Booklet, Coughing and Deep Breathing, MRSA Information and Surgical Site Infection Prevention

## 2011-12-19 NOTE — Pre-Procedure Instructions (Incomplete)
20 Jadda M Speigner  12/19/2011   Your procedure is scheduled on:  Tuesday December 25, 2011  Report to [facility] at 0530 AM.  Call this number if you have problems the morning of surgery: [phone]   Remember:   Do not eat food:After Midnight.  May have clear liquids: up to 4 Hours before arrival. (up to 1:30am)  Clear liquids include soda, tea, black coffee, apple or grape juice, broth.  Take these medicines the morning of surgery with A SIP OF WATER: albuterol, adderall, nexium, prozac, vicodin, toprol XL, Loestrin, ultram   Do not wear jewelry, make-up or nail polish.  Do not wear lotions, powders, or perfumes. You may wear deodorant.  Do not shave 48 hours prior to surgery.  Do not bring valuables to the hospital.  Contacts, dentures or bridgework may not be worn into surgery.  Leave suitcase in the car. After surgery it may be brought to your room.  For patients admitted to the hospital, checkout time is 11:00 AM the day of discharge.   Patients discharged the day of surgery will not be allowed to drive home.  Name and phone number of your driver: ***  Special Instructions: CHG Shower Use Special Wash: 1/2 bottle night before surgery and 1/2 bottle morning of surgery.   Please read over the following fact sheets that you were given: Pain Booklet, Coughing and Deep Breathing, MRSA Information and Surgical Site Infection Prevention

## 2011-12-20 ENCOUNTER — Inpatient Hospital Stay (HOSPITAL_COMMUNITY): Admission: RE | Admit: 2011-12-20 | Payer: 59 | Source: Ambulatory Visit

## 2011-12-24 MED ORDER — CEFAZOLIN SODIUM-DEXTROSE 2-3 GM-% IV SOLR
2.0000 g | INTRAVENOUS | Status: AC
  Administered 2011-12-25: 2 g via INTRAVENOUS
  Filled 2011-12-24: qty 50

## 2011-12-25 ENCOUNTER — Encounter (HOSPITAL_COMMUNITY)
Admission: RE | Disposition: A | Discharge status: Home / Self Care | Payer: Self-pay | Source: Ambulatory Visit | Attending: General Surgery

## 2011-12-25 ENCOUNTER — Other Ambulatory Visit (INDEPENDENT_AMBULATORY_CARE_PROVIDER_SITE_OTHER): Payer: Self-pay | Admitting: General Surgery

## 2011-12-25 ENCOUNTER — Encounter (HOSPITAL_COMMUNITY): Payer: Self-pay | Admitting: *Deleted

## 2011-12-25 ENCOUNTER — Ambulatory Visit (HOSPITAL_COMMUNITY)
Admission: RE | Admit: 2011-12-25 | Discharge: 2011-12-25 | Disposition: A | Discharge status: Home / Self Care | Payer: 59 | Source: Ambulatory Visit | Attending: General Surgery | Admitting: General Surgery

## 2011-12-25 ENCOUNTER — Encounter (HOSPITAL_COMMUNITY): Payer: Self-pay | Admitting: Anesthesiology

## 2011-12-25 ENCOUNTER — Ambulatory Visit (HOSPITAL_COMMUNITY): Payer: 59

## 2011-12-25 ENCOUNTER — Ambulatory Visit (HOSPITAL_COMMUNITY): Payer: 59 | Admitting: Anesthesiology

## 2011-12-25 DIAGNOSIS — IMO0001 Reserved for inherently not codable concepts without codable children: Secondary | ICD-10-CM | POA: Insufficient documentation

## 2011-12-25 DIAGNOSIS — K219 Gastro-esophageal reflux disease without esophagitis: Secondary | ICD-10-CM | POA: Insufficient documentation

## 2011-12-25 DIAGNOSIS — K8020 Calculus of gallbladder without cholecystitis without obstruction: Secondary | ICD-10-CM | POA: Insufficient documentation

## 2011-12-25 DIAGNOSIS — F329 Major depressive disorder, single episode, unspecified: Secondary | ICD-10-CM | POA: Insufficient documentation

## 2011-12-25 DIAGNOSIS — F988 Other specified behavioral and emotional disorders with onset usually occurring in childhood and adolescence: Secondary | ICD-10-CM | POA: Insufficient documentation

## 2011-12-25 DIAGNOSIS — G43909 Migraine, unspecified, not intractable, without status migrainosus: Secondary | ICD-10-CM | POA: Insufficient documentation

## 2011-12-25 DIAGNOSIS — Z01810 Encounter for preprocedural cardiovascular examination: Secondary | ICD-10-CM | POA: Insufficient documentation

## 2011-12-25 DIAGNOSIS — K5730 Diverticulosis of large intestine without perforation or abscess without bleeding: Secondary | ICD-10-CM | POA: Insufficient documentation

## 2011-12-25 DIAGNOSIS — I4949 Other premature depolarization: Secondary | ICD-10-CM | POA: Insufficient documentation

## 2011-12-25 DIAGNOSIS — J45909 Unspecified asthma, uncomplicated: Secondary | ICD-10-CM | POA: Insufficient documentation

## 2011-12-25 DIAGNOSIS — Z79899 Other long term (current) drug therapy: Secondary | ICD-10-CM | POA: Insufficient documentation

## 2011-12-25 DIAGNOSIS — Z01812 Encounter for preprocedural laboratory examination: Secondary | ICD-10-CM | POA: Insufficient documentation

## 2011-12-25 DIAGNOSIS — K8010 Calculus of gallbladder with chronic cholecystitis without obstruction: Secondary | ICD-10-CM

## 2011-12-25 DIAGNOSIS — K824 Cholesterolosis of gallbladder: Secondary | ICD-10-CM

## 2011-12-25 DIAGNOSIS — F3289 Other specified depressive episodes: Secondary | ICD-10-CM | POA: Insufficient documentation

## 2011-12-25 HISTORY — PX: CHOLECYSTECTOMY: SHX55

## 2011-12-25 SURGERY — LAPAROSCOPIC CHOLECYSTECTOMY WITH INTRAOPERATIVE CHOLANGIOGRAM
Anesthesia: General | Site: Abdomen | Wound class: Contaminated

## 2011-12-25 MED ORDER — SODIUM CHLORIDE 0.9 % IR SOLN
Status: DC | PRN
  Administered 2011-12-25: 1000 mL

## 2011-12-25 MED ORDER — ONDANSETRON HCL 4 MG/2ML IJ SOLN
INTRAMUSCULAR | Status: DC | PRN
  Administered 2011-12-25: 4 mg via INTRAVENOUS

## 2011-12-25 MED ORDER — ROCURONIUM BROMIDE 100 MG/10ML IV SOLN
INTRAVENOUS | Status: DC | PRN
  Administered 2011-12-25: 10 mg via INTRAVENOUS
  Administered 2011-12-25: 40 mg via INTRAVENOUS

## 2011-12-25 MED ORDER — MORPHINE SULFATE 2 MG/ML IJ SOLN: 0.0500 mg/kg | INTRAMUSCULAR | Status: DC | PRN

## 2011-12-25 MED ORDER — PROPOFOL 10 MG/ML IV EMUL
INTRAVENOUS | Status: DC | PRN
  Administered 2011-12-25: 150 mg via INTRAVENOUS

## 2011-12-25 MED ORDER — HYDROCODONE-ACETAMINOPHEN 10-325 MG PO TABS
2.0000 | ORAL_TABLET | ORAL | Status: DC
  Administered 2011-12-25: 2 via ORAL

## 2011-12-25 MED ORDER — MEPERIDINE HCL 25 MG/ML IJ SOLN: 6.2500 mg | INTRAMUSCULAR | Status: DC | PRN

## 2011-12-25 MED ORDER — NEOSTIGMINE METHYLSULFATE 1 MG/ML IJ SOLN
INTRAMUSCULAR | Status: DC | PRN
  Administered 2011-12-25: 5 mg via INTRAVENOUS

## 2011-12-25 MED ORDER — DIPHENHYDRAMINE HCL 50 MG/ML IJ SOLN
12.5000 mg | Freq: Once | INTRAMUSCULAR | Status: AC
  Administered 2011-12-25: 12.5 mg via INTRAVENOUS

## 2011-12-25 MED ORDER — HYDROCODONE-ACETAMINOPHEN 5-500 MG PO TABS: 1.0000 | ORAL_TABLET | ORAL | Status: AC | PRN

## 2011-12-25 MED ORDER — MIDAZOLAM HCL 5 MG/5ML IJ SOLN
INTRAMUSCULAR | Status: DC | PRN
  Administered 2011-12-25: 2 mg via INTRAVENOUS

## 2011-12-25 MED ORDER — DEXAMETHASONE SODIUM PHOSPHATE 10 MG/ML IJ SOLN
INTRAMUSCULAR | Status: DC | PRN
  Administered 2011-12-25: 8 mg via INTRAVENOUS

## 2011-12-25 MED ORDER — LACTATED RINGERS IV SOLN
INTRAVENOUS | Status: DC | PRN
  Administered 2011-12-25 (×3): via INTRAVENOUS

## 2011-12-25 MED ORDER — HEMOSTATIC AGENTS (NO CHARGE) OPTIME
TOPICAL | Status: DC | PRN
  Administered 2011-12-25: 1 via TOPICAL

## 2011-12-25 MED ORDER — LIDOCAINE-EPINEPHRINE (PF) 1 %-1:200000 IJ SOLN
INTRAMUSCULAR | Status: DC | PRN
  Administered 2011-12-25: 09:00:00 via SUBCUTANEOUS

## 2011-12-25 MED ORDER — GLYCOPYRROLATE 0.2 MG/ML IJ SOLN
INTRAMUSCULAR | Status: DC | PRN
  Administered 2011-12-25: .6 mg via INTRAVENOUS

## 2011-12-25 MED ORDER — HYDROMORPHONE HCL PF 1 MG/ML IJ SOLN
0.2500 mg | INTRAMUSCULAR | Status: DC | PRN
  Administered 2011-12-25: 0.25 mg via INTRAVENOUS

## 2011-12-25 MED ORDER — FENTANYL CITRATE 0.05 MG/ML IJ SOLN
INTRAMUSCULAR | Status: DC | PRN
  Administered 2011-12-25: 50 ug via INTRAVENOUS
  Administered 2011-12-25: 100 ug via INTRAVENOUS
  Administered 2011-12-25 (×5): 50 ug via INTRAVENOUS

## 2011-12-25 MED ORDER — PROMETHAZINE HCL 25 MG/ML IJ SOLN: 6.2500 mg | INTRAMUSCULAR | Status: DC | PRN

## 2011-12-25 MED ORDER — IOHEXOL 300 MG/ML  SOLN
INTRAMUSCULAR | Status: DC | PRN
  Administered 2011-12-25: 8 mL

## 2011-12-25 MED ORDER — LIDOCAINE HCL (CARDIAC) 20 MG/ML IV SOLN
INTRAVENOUS | Status: DC | PRN
  Administered 2011-12-25: 50 mg via INTRAVENOUS

## 2011-12-25 MED ORDER — MORPHINE SULFATE 4 MG/ML IJ SOLN: 0.0500 mg/kg | INTRAMUSCULAR | Status: DC | PRN

## 2011-12-25 MED ORDER — 0.9 % SODIUM CHLORIDE (POUR BTL) OPTIME
TOPICAL | Status: DC | PRN
  Administered 2011-12-25: 1000 mL

## 2011-12-25 MED ORDER — LACTATED RINGERS IV SOLN: INTRAVENOUS | Status: DC

## 2011-12-25 SURGICAL SUPPLY — 49 items
ADH SKN CLS APL DERMABOND .7 (GAUZE/BANDAGES/DRESSINGS)
APPLIER CLIP ROT 10 11.4 M/L (STAPLE) ×2
APR CLP MED LRG 11.4X10 (STAPLE) ×1
BAG SPEC RTRVL LRG 6X4 10 (ENDOMECHANICALS) ×1
BLADE SURG ROTATE 9660 (MISCELLANEOUS) IMPLANT
CANISTER SUCTION 2500CC (MISCELLANEOUS) ×2 IMPLANT
CHLORAPREP W/TINT 26ML (MISCELLANEOUS) ×2 IMPLANT
CLIP APPLIE ROT 10 11.4 M/L (STAPLE) ×1 IMPLANT
CLOTH BEACON ORANGE TIMEOUT ST (SAFETY) ×2 IMPLANT
COVER SURGICAL LIGHT HANDLE (MISCELLANEOUS) ×2 IMPLANT
DECANTER SPIKE VIAL GLASS SM (MISCELLANEOUS) ×4 IMPLANT
DERMABOND ADVANCED (GAUZE/BANDAGES/DRESSINGS)
DERMABOND ADVANCED .7 DNX12 (GAUZE/BANDAGES/DRESSINGS) ×1 IMPLANT
DEVICE TROCAR PUNCTURE CLOSURE (ENDOMECHANICALS) ×1 IMPLANT
DRAPE C-ARM 42X72 X-RAY (DRAPES) ×2 IMPLANT
ELECT CAUTERY BLADE 6.4 (BLADE) ×2 IMPLANT
ELECT REM PT RETURN 9FT ADLT (ELECTROSURGICAL) ×2
ELECTRODE REM PT RTRN 9FT ADLT (ELECTROSURGICAL) ×1 IMPLANT
GLOVE BIOGEL PI IND STRL 7.0 (GLOVE) IMPLANT
GLOVE BIOGEL PI IND STRL 7.5 (GLOVE) IMPLANT
GLOVE BIOGEL PI INDICATOR 7.0 (GLOVE) ×2
GLOVE BIOGEL PI INDICATOR 7.5 (GLOVE) ×1
GLOVE SURG SS PI 7.5 STRL IVOR (GLOVE) ×4 IMPLANT
GLOVE SURG SS PI 8.0 STRL IVOR (GLOVE) ×2 IMPLANT
GOWN PREVENTION PLUS XLARGE (GOWN DISPOSABLE) ×3 IMPLANT
GOWN STRL NON-REIN LRG LVL3 (GOWN DISPOSABLE) ×6 IMPLANT
HEMOSTAT SURGICEL 2X14 (HEMOSTASIS) ×1 IMPLANT
IV CATH 14GX2 1/4 (CATHETERS) ×1 IMPLANT
KIT BASIN OR (CUSTOM PROCEDURE TRAY) ×2 IMPLANT
KIT ROOM TURNOVER OR (KITS) ×2 IMPLANT
NS IRRIG 1000ML POUR BTL (IV SOLUTION) ×2 IMPLANT
PAD ARMBOARD 7.5X6 YLW CONV (MISCELLANEOUS) ×4 IMPLANT
PENCIL BUTTON HOLSTER BLD 10FT (ELECTRODE) ×2 IMPLANT
POUCH SPECIMEN RETRIEVAL 10MM (ENDOMECHANICALS) ×2 IMPLANT
SCISSORS LAP 5X35 DISP (ENDOMECHANICALS) IMPLANT
SET CHOLANGIOGRAPH 5 50 .035 (SET/KITS/TRAYS/PACK) ×1 IMPLANT
SET IRRIG TUBING LAPAROSCOPIC (IRRIGATION / IRRIGATOR) ×2 IMPLANT
SLEEVE ENDOPATH XCEL 5M (ENDOMECHANICALS) ×2 IMPLANT
SPECIMEN JAR SMALL (MISCELLANEOUS) ×2 IMPLANT
SUT MNCRL AB 4-0 PS2 18 (SUTURE) ×2 IMPLANT
SUT VICRYL 0 UR6 27IN ABS (SUTURE) ×5 IMPLANT
TOWEL OR 17X24 6PK STRL BLUE (TOWEL DISPOSABLE) ×2 IMPLANT
TOWEL OR 17X26 10 PK STRL BLUE (TOWEL DISPOSABLE) ×2 IMPLANT
TRAY FOLEY CATH 14FR (SET/KITS/TRAYS/PACK) IMPLANT
TRAY LAPAROSCOPIC (CUSTOM PROCEDURE TRAY) ×2 IMPLANT
TROCAR HASSON GELL 12X100 (TROCAR) ×2 IMPLANT
TROCAR XCEL NON-BLD 11X100MML (ENDOMECHANICALS) ×2 IMPLANT
TROCAR XCEL NON-BLD 5MMX100MML (ENDOMECHANICALS) ×2 IMPLANT
WATER STERILE IRR 1000ML POUR (IV SOLUTION) IMPLANT

## 2011-12-25 NOTE — Op Note (Signed)
NAME:  Brenda Little, Brenda Little                   ACCOUNT NO.:  [id number]  MEDICAL RECORD NO.:  [id number]  LOCATION:  [facility]                         FACILITY:  [facility]  PHYSICIAN:  Lodema Pilot, MD       DATE OF BIRTH:  11/04/1974  DATE OF PROCEDURE:  12/25/2011 DATE OF DISCHARGE:                              OPERATIVE REPORT   PROCEDURE:  Laparoscopic lysis of adhesions with laparoscopic cholecystectomy and intraoperative cholangiogram.  PREOPERATIVE DIAGNOSIS:  Cholelithiasis.  POSTOPERATIVE DIAGNOSIS:  Cholelithiasis.  SURGEON:  Lodema Pilot, MD  ASSISTANT:  Velora Heckler, MD  ANESTHESIA:  General endotracheal anesthesia with 33 mL of 1% lidocaine with epinephrine, 0.25% Marcaine and 50:50 mixture.  FLUIDS:  2 L crystalloid.  ESTIMATED BLOOD LOSS:  Minimal.  DRAINS:  None.  SPECIMENS:  Gallbladder and contents sent to Pathology for permanent sectioning.  COMPLICATIONS:  None apparent.  FINDINGS:  Upper abdominal adhesions as well as lower abdominal and right-sided abdominal adhesions with a few left lateral abdominal wall omental adhesions.  A thickened inflamed gallbladder with normal intraoperative cholangiogram.  Surgicel gauze was placed in the gallbladder fossa.  INDICATION FOR PROCEDURE:  Brenda Little is a 41 year old female with frequent episodes of postprandial right upper quadrant pain and gallstones found on abdominal ultrasound.  She has been to the emergency room and has had a history of fevers as well.  OPERATIVE DETAILS:  Brenda Little was seen and evaluated in preoperative area and risks and benefits of procedure were again discussed in lay terms. Informed consent was obtained.  Prophylactic antibiotics were given. She was taken to the operating room, placed on table in supine position and general endotracheal anesthesia was obtained.  Her abdomen was prepped and draped in a standard surgical fashion.  A supraumbilical midline incision was made in the skin and area of  her prior midline incision.  Dissection carried down to the abdominal wall fascia using sharp dissection.  Peritoneum was entered under direct visualization.  I did not feel like we went through any surgical mesh during entry. Although after I placed a 12-mm Hasson port, a pneumoperitoneum was obtained.  Laparoscope was introduced in the abdomen and I could see some mesh along the anterior abdominal wall and inferior to her trocar site.  She also had some upper abdominal midline adhesions with the transverse colon adhered to the abdominal wall and falciform ligament with few omental adhesions in the right upper quadrant, and some lower abdominal and left lower quadrant adhesions.  A 5-mm trocar was placed in the right lateral abdomen.  This allowed [place] to take down some of the adhesions in the abdominal wall using sharp dissection.  Then I cleared for a 2nd 5-mm right upper quadrant port near its usual desired location.  With the structure ports in place, able to sharply perform adhesiolysis in the abdominal wall near the midline of the falciform ligament, taking the transverse colon down from the abdominal wall. These adhesions were not dense and the colon was not closely adherent to the wall and I did not feel like we were at risk for any bowel injury during adhesiolysis.  After we cleared enough space  for placement of an 11-mm trocar, this was placed in the epigastrium and the gallbladder was retracted cephalad.  She had some omental adhesions to the gallbladder and the gallbladder appeared intrahepatically contracted.  The adhesions were taken down using blunt dissection and sharp dissection and the Hartman's pouch was also retracted laterally.  The cystic duct was skeletonized using blunt dissection, and a window was created through the triangle of CLO visualizing a single cystic duct coursing up onto the gallbladder.  Clip was placed on the gallbladder side and a small cystic  ductotomy was made and a cholangiogram catheter was placed through the abdominal wall through a separate stab incision and placed into the cystic duct and a cholangiogram was performed which demonstrated free flow of bile into the duodenum and no filling defects in the common bile duct, visualization of right and left hepatic ducts and single cystic duct.  After normal cholangiogram was performed, cholangiogram catheter was removed and 3 clips were placed on the cystic duct stump and the duct was transected.  Cystic artery was skeletonized and a clip was placed on the cystic artery, it appeared to be a small branch and then after this was divided, the gallbladder was removed from the gallbladder fossa using Bovie electrocautery.  The gallbladder was fairly intrahepatic and the gallbladder was entered during the dissection.  With some spillage of normal-appearing bile, there was no spillage of any gallstones.  The gallbladder was completely removed and placed in an EndoCatch bag and removed through the epigastric trocar site in order to stay far away from the prior hernia mesh as possible. There were some palpable gallstones in the gallbladder and after the gallbladder was removed, the gallbladder fossa was inspected for hemostasis which was noted to be adequate.  Although because the gallbladder was so atraumatic and there was some venous-appearing bleeding during the removal of the gallbladder from the gallbladder fossa, although that has been controlled, decided to place some Surgicel gauze near the gallbladder fossa and the right upper quadrant was irrigated and suctioned until irrigation returned clear.  Again the right upper quadrant appeared hemostatic and again inspected the abdominal wall for hemostasis which was noted to be adequate as well as the omentum and the bowel for hemostasis.  There was no evidence of bowel injury or bleeding.  The umbilical trocar site was closed in  open fashion with interrupted 2-0 Vicryl sutures and the epigastric trocar fascia was approximated with 0 Vicryl suture and Endoclose device.  The abdomen was reinsufflated with carbon dioxide gas and the abdominal wall closure was noted to be adequate at the trocar sites.  There was no evidence of bowel injury, and the right upper quadrant trocars were removed.  The wounds were injected with total of 30 mL of 1% lidocaine with epinephrine.  A 0.25% Marcaine in a 50:50 mixture and skin edges were approximated with 4-0 Monocryl subcuticular suture.  Skin was washed and dried.  Dermabond was applied.  All sponge, needle, and instrument counts were correct in the case.  The patient tolerated the procedure well without apparent complications.          ______________________________ Lodema Pilot, MD     BL/MEDQ  D:  12/25/2011  T:  12/25/2011  Job:  [id number]

## 2011-12-25 NOTE — Anesthesia Procedure Notes (Signed)
Procedure Name: Intubation Date/Time: 12/25/2011 7:38 AM Performed by: Romie Minus Pre-anesthesia Checklist: Patient identified, Emergency Drugs available, Patient being monitored and Suction available Patient Re-evaluated:Patient Re-evaluated prior to inductionOxygen Delivery Method: Circle System Utilized Preoxygenation: Pre-oxygenation with 100% oxygen Intubation Type: IV induction Ventilation: Mask ventilation without difficulty and Oral airway inserted - appropriate to patient size Laryngoscope Size: Miller and 2 Grade View: Grade I Tube type: Oral Tube size: 7.5 mm Number of attempts: 1 Placement Confirmation: ETT inserted through vocal cords under direct vision,  positive ETCO2 and breath sounds checked- equal and bilateral Secured at: 22 cm Tube secured with: Tape Dental Injury: Teeth and Oropharynx as per pre-operative assessment

## 2011-12-25 NOTE — Anesthesia Preprocedure Evaluation (Addendum)
Anesthesia Evaluation  Patient identified by MRN, date of birth, ID band Patient awake    Reviewed: Allergy & Precautions, H&P , NPO status , Patient's Chart, lab work & pertinent test results, reviewed documented beta blocker date and time   Airway Mallampati: III TM Distance: >3 FB Neck ROM: Full    Dental  (+) Teeth Intact and Dental Advisory Given   Pulmonary asthma , pneumonia ,  Pneumonia Fall 2012 clear to auscultation        Cardiovascular + dysrhythmias Normal Irregular HR per pt; takes metoprolol daily   Neuro/Psych  Headaches, Depression  Neuromuscular disease    GI/Hepatic Neg liver ROS, hiatal hernia, GERD-  Medicated and Controlled,  Endo/Other  Negative Endocrine ROS  Renal/GU negative Renal ROS     Musculoskeletal  (+) Fibromyalgia -  Abdominal   Peds  Hematology negative hematology ROS (+)   Anesthesia Other Findings   Reproductive/Obstetrics                         Anesthesia Physical Anesthesia Plan  ASA: III  Anesthesia Plan: General   Post-op Pain Management:    Induction: Intravenous  Airway Management Planned: Oral ETT  Additional Equipment:   Intra-op Plan:   Post-operative Plan:   Informed Consent: I have reviewed the patients History and Physical, chart, labs and discussed the procedure including the risks, benefits and alternatives for the proposed anesthesia with the patient or authorized representative who has indicated his/her understanding and acceptance.     Plan Discussed with: CRNA  Anesthesia Plan Comments:         Anesthesia Quick Evaluation

## 2011-12-25 NOTE — Interval H&P Note (Signed)
History and Physical Interval Note:  12/25/2011 7:10 AM  Brenda Little  has presented today for surgery, with the diagnosis of to remove gallbladder for relief of abdominal pain, cholelithiasis  The various methods of treatment have been discussed with the patient and family. After consideration of risks, benefits and other options for treatment, the patient has consented to  Procedure(s): LAPAROSCOPIC CHOLECYSTECTOMY WITH INTRAOPERATIVE CHOLANGIOGRAM as a surgical intervention .  The patients' history has been reviewed, patient examined, no change in status, stable for surgery.  I have reviewed the patients' chart and labs.  Questions were answered to the patient's satisfaction.  She denies any change from prior.  Still having frequent postprandial pain.  The risks of infection, bleeding, pain, persistent symptoms, scarring, injury to bowel or bile ducts, retained stone, diarrhea, need for additional procedures, and need for open surgery discussed with the patient.  She desires to proceed with lap chole possible IOC.  Lodema Pilot DAVID

## 2011-12-25 NOTE — Anesthesia Postprocedure Evaluation (Signed)
  Anesthesia Post-op Note  Patient: Brenda Little  Procedure(s) Performed:  LAPAROSCOPIC CHOLECYSTECTOMY WITH INTRAOPERATIVE CHOLANGIOGRAM - Laparoscopic cholecystectomy with cholangiogram   Patient Location: PACU  Anesthesia Type: General  Level of Consciousness: awake  Airway and Oxygen Therapy: Patient Spontanous Breathing  Post-op Pain: mild  Post-op Assessment: Post-op Vital signs reviewed  Post-op Vital Signs: stable  Complications: No apparent anesthesia complications

## 2011-12-25 NOTE — Brief Op Note (Signed)
12/25/2011  9:42 AM  PATIENT:  Brenda Little  41 y.o. female  PRE-OPERATIVE DIAGNOSIS:  abdominal pain, cholelithiasis  POST-OPERATIVE DIAGNOSIS:  abdominal pain, cholelithiasis  PROCEDURE:  Procedure(s): LAPAROSCOPIC CHOLECYSTECTOMY WITH INTRAOPERATIVE CHOLANGIOGRAM  SURGEON:  Surgeon(s): Rulon Abide, DO Velora Heckler, MD  PHYSICIAN ASSISTANT:   ASSISTANTS: Gerkin   ANESTHESIA:   general  EBL:  Total I/O In: 2000 [I.V.:2000] Out: 75 [Blood:75]  BLOOD ADMINISTERED:none  DRAINS: none   LOCAL MEDICATIONS USED:  MARCAINE 16CC and LIDOCAINE 16CC  SPECIMEN:  Source of Specimen:  gallbladder  DISPOSITION OF SPECIMEN:  PATHOLOGY  COUNTS:  YES  TOURNIQUET:  * No tourniquets in log *  DICTATION: .Other Dictation: Dictation Number [id number]  PLAN OF CARE: Discharge to home after PACU  PATIENT DISPOSITION:  PACU - hemodynamically stable.   Delay start of Pharmacological VTE agent (>24hrs) due to surgical blood loss or risk of bleeding:  {YES/NO/NOT APPLICABLE:20182

## 2011-12-25 NOTE — Transfer of Care (Signed)
Immediate Anesthesia Transfer of Care Note  Patient: Brenda Little  Procedure(s) Performed:  LAPAROSCOPIC CHOLECYSTECTOMY WITH INTRAOPERATIVE CHOLANGIOGRAM - Laparoscopic cholecystectomy with cholangiogram   Patient Location: PACU  Anesthesia Type: General  Level of Consciousness: awake  Airway & Oxygen Therapy: Patient Spontanous Breathing and Patient connected to nasal cannula oxygen  Post-op Assessment: Report given to PACU RN and Post -op Vital signs reviewed and stable  Post vital signs: stable  Complications: No apparent anesthesia complications

## 2011-12-25 NOTE — H&P (View-Only) (Signed)
Patient ID: Brenda Little, female   DOB: 11/04/1974, 41 y.o.   MRN: [id number]  Chief Complaint  Patient presents with  . Pre-op Exam    eval gallstones    HPI Brenda Little is a 41 y.o. female.  This patient is referred by Dr. Knapp for evaluation of right upper quadrant abdominal pain and gallstones. She has had right upper quadrant discomfort and pain which she describes as "constant" pain with associated nausea and vomiting and the pain radiates down her side. She states it is worse with eating and recently tried eating ice cream and this caused her to vomit for 20 minutes. She has a history of diverticulosis and diverticulitis and was that she felt that her symptoms were due to the diverticulosis. She was seen twice in the emergency room in December for evaluation which demonstrated gallstones on ultrasound but liver function tests were normal. At that time she had fevers of up to 103. She has also had associated hiccuping and burping she has a history of reflux but this is well-controlled with her Nexium. She states that her bowels are irregular but they are normally constipated. HPI  Past Medical History  Diagnosis Date  . Allergy   . Asthma   . PCOS (polycystic ovarian syndrome)   . Cancer     VAGINAL/VULVAR  . Uterine fibroid   . Hyperlipidemia   . Vitamin D deficiency   . Fibromyalgia   . Anemia   . Migraine   . GERD (gastroesophageal reflux disease)   . HSV-2 (herpes simplex virus 2) infection     PER BLOOD TEST  . ADD (attention deficit hyperactivity disorder, inattentive type)   . Depression   . PVC's (premature ventricular contractions)     DR.LITTLE  . Diverticulitis   . Abdominal  pain, other specified site   . Constipation   . Nausea and vomiting     Past Surgical History  Procedure Date  . Squamous cell carcinoma excision 2010+2011    VULVAR CA EXCISION X 2 (DR HOLLAND)  . Sigmoidectomy 1997    DIVERTICULITS  . Colonoscopy 2000/2008  . Hernia repair 09/2010      VENTRAL  . Appendectomy   . Colon surgery     Family History  Problem Relation Age of Onset  . Hypertension Mother   . Fibromyalgia Mother   . Heart attack Father   . Sarcoidosis Father   . ADD / ADHD Sister   . Asthma Brother   . ADD / ADHD Brother   . Cancer Maternal Aunt 40    breast  . Cancer Maternal Aunt 40    breast  . Cancer Paternal Grandmother     uterine cancer    Social History History  Substance Use Topics  . Smoking status: Never Smoker   . Smokeless tobacco: Never Used  . Alcohol Use: Yes     social,1-2 drinks every other week    Allergies  Allergen Reactions  . Latex     blisters  . Codeine Hives, Itching and Rash  . Hydrocodone Hives, Itching and Rash    Current Outpatient Prescriptions  Medication Sig Dispense Refill  . albuterol (PROVENTIL HFA;VENTOLIN HFA) 108 (90 BASE) MCG/ACT inhaler Inhale 2 puffs into the lungs every 6 (six) hours as needed for wheezing.  48 g  0  . amphetamine-dextroamphetamine (ADDERALL XR) 30 MG 24 hr capsule Take 1 capsule (30 mg total) by mouth every morning.  90 capsule  0  .   cholecalciferol (VITAMIN D) 1000 UNITS tablet Take 1,000 Units by mouth daily.        . esomeprazole (NEXIUM) 40 MG capsule Take 1 capsule (40 mg total) by mouth daily before breakfast.  90 capsule  3  . FLUoxetine (PROZAC) 40 MG capsule Take 1 capsule (40 mg total) by mouth daily.  90 capsule  1  . Norethin-Eth Estrad-Fe Biphas (LO LOESTRIN FE) 1 MG-10 MCG / 10 MCG TABS Take by mouth daily.        . Pseudoeph-Doxylamine-DM-APAP (NYQUIL) 60-7.04-24-999 MG/30ML LIQD Take 30 mLs by mouth at bedtime.        . TOPROL XL 50 MG 24 hr tablet Take 1 tablet (50 mg total) by mouth daily.  90 tablet  3  . traMADol (ULTRAM) 50 MG tablet Take 1 tablet (50 mg total) by mouth every 6 (six) hours as needed for pain. Maximum dose= 8 tablets per day  30 tablet  0  . amphetamine-dextroamphetamine (ADDERALL) 5 MG tablet Take 1 tablet (5 mg total) by mouth daily.   90 tablet  0    Review of Systems Review of Systems All other review of systems negative or noncontributory except as stated in the HPI  Blood pressure 126/80, pulse 59, temperature 97.9 F (36.6 C), temperature source Temporal, height 5' 4" (1.626 m), weight 214 lb 12.8 oz (97.433 kg), SpO2 98.00%.  Physical Exam Physical Exam  Vitals reviewed. Constitutional: She is oriented to person, place, and time. She appears well-developed and well-nourished. No distress.  HENT:  Head: Normocephalic and atraumatic.  Mouth/Throat: No oropharyngeal exudate.  Eyes: Conjunctivae are normal. Pupils are equal, round, and reactive to light. Right eye exhibits no discharge. Left eye exhibits no discharge. No scleral icterus.  Neck: Normal range of motion. Neck supple. No tracheal deviation present.  Cardiovascular: Normal rate, regular rhythm and normal heart sounds.   Pulmonary/Chest: Effort normal and breath sounds normal. No stridor. No respiratory distress. She has no wheezes. She has no rales. She exhibits no tenderness.  Abdominal: Soft. Bowel sounds are normal. She exhibits no distension and no mass. There is tenderness. There is no rebound and no guarding.       Mild ruq tenderness, lower midline scar, no hernias appreciated.  Musculoskeletal: Normal range of motion. She exhibits no edema and no tenderness.  Neurological: She is alert and oriented to person, place, and time. No cranial nerve deficit.  Skin: Skin is warm and dry. No rash noted. She is not diaphoretic. No erythema. No pallor.  Psychiatric: She has a normal mood and affect. Her behavior is normal. Judgment and thought content normal.    Data Reviewed US, labs   Assessment    Symptomatic cholelithiasis I think that her symptoms certainly could be due to symptomatic cholelithiasis. She has no quadrant pain after eating and gallstones on ultrasound and I am asked to concern which she may have had cholecystitis as well given her  fevers while in the emergency room. I discussed with her the options of cholecystectomy for treatment of her symptoms she is interested in proceeding with surgery. We talked about the alternatives of continued observation versus surgery and the risks of each and we discussed the risks of infection, bleeding, pain, scarring, persistent symptoms, injury to bowel or bile ducts, diarrhea, and need for open surgery and she expressed understanding and desires to proceed with cholecystectomy.    Plan    We will set her up for cholecystectomy when available.         Rayburn Mundis DAVID 12/07/2011, 12:20 PM    

## 2011-12-25 NOTE — Preoperative (Signed)
Beta Blockers   Reason not to administer Beta Blockers:Not Applicable 

## 2011-12-26 ENCOUNTER — Encounter (HOSPITAL_COMMUNITY): Payer: Self-pay | Admitting: General Surgery

## 2011-12-28 ENCOUNTER — Telehealth (INDEPENDENT_AMBULATORY_CARE_PROVIDER_SITE_OTHER): Payer: Self-pay | Admitting: General Surgery

## 2012-01-02 NOTE — Telephone Encounter (Signed)
Returning patient call, left message to call our office.

## 2012-01-11 ENCOUNTER — Encounter (INDEPENDENT_AMBULATORY_CARE_PROVIDER_SITE_OTHER): Payer: Self-pay | Admitting: General Surgery

## 2012-01-11 ENCOUNTER — Encounter (INDEPENDENT_AMBULATORY_CARE_PROVIDER_SITE_OTHER): Payer: Self-pay

## 2012-01-11 ENCOUNTER — Ambulatory Visit (INDEPENDENT_AMBULATORY_CARE_PROVIDER_SITE_OTHER): Payer: 59 | Admitting: General Surgery

## 2012-01-11 VITALS — BP 116/76 | HR 82 | Resp 14 | Ht 64.0 in | Wt 213.8 lb

## 2012-01-11 DIAGNOSIS — Z4889 Encounter for other specified surgical aftercare: Secondary | ICD-10-CM

## 2012-01-11 DIAGNOSIS — Z5189 Encounter for other specified aftercare: Secondary | ICD-10-CM

## 2012-01-11 NOTE — Progress Notes (Signed)
Subjective:     Patient ID: Brenda Little, female   DOB: 07-Oct-1974, 41 y.o.   MRN: [id number]  HPI This patient follows up 2 weeks status post laparoscopic cholecystectomy. She states that she feels much better than before surgery although she still has had approximately 3 episodes of nausea and vomiting since her procedure. One of the episodes was due to what she thought was a stomach bug and the other one occurred after eating a hamburger and french fries at [facility]. Overall she says that her pain is improving and she denies any fevers or chills she states that her bowels are functioning normally.  Review of Systems     Objective:   Physical Exam No distress and nontoxic-appearing Her abdomen is soft and has no significant discomfort. She is nondistended. Her incisions are healing well without sign of infection I did trim back the exposed suture in the epigastric trocar site.    Assessment:     Status post left upper cholecystectomy-improving Overall she seems to be much improved as compared to before surgery. I'm not really sure what to make of the episodes of nausea after her procedure. I think that 2 of these could be explained with her postoperative stomach virus and with eating some inappropriate foods soon after surgery however she may have a concurrent issue as well. I do not see any evidence for any postoperative complication and overall she looks and feels much better.    Plan:     I recommended that she come back and see me in 2 or 3 weeks for reevaluation but if she is completely improved, I think that should be okay to follow up on a p.r.n. basis as she looks pretty well.

## 2012-01-21 ENCOUNTER — Telehealth (INDEPENDENT_AMBULATORY_CARE_PROVIDER_SITE_OTHER): Payer: Self-pay

## 2012-01-21 NOTE — Telephone Encounter (Signed)
Faxed FMLA forms with date correction for onset of symptoms & illness (11/12/11)

## 2012-01-29 ENCOUNTER — Encounter (INDEPENDENT_AMBULATORY_CARE_PROVIDER_SITE_OTHER): Payer: 59 | Admitting: General Surgery

## 2012-01-29 ENCOUNTER — Telehealth (INDEPENDENT_AMBULATORY_CARE_PROVIDER_SITE_OTHER): Payer: Self-pay | Admitting: General Surgery

## 2012-01-29 NOTE — Telephone Encounter (Signed)
I called the patient to follow up on the nausea.  SHe says that she has not had any more nausea and feels well.  She has not "had any more gallbladder problems".  She has returned to work. She will follow up with me as needed.

## 2012-02-08 ENCOUNTER — Telehealth: Payer: Self-pay | Admitting: Family Medicine

## 2012-02-08 NOTE — Telephone Encounter (Signed)
It is always preferred to have OV for diverticulitis.  We need to assess the severity to determine which ABX are needed, whether or not labs are needed (ie CBC), and sometimes if severe enough, CT is needed.  I don't have paper chart, but don't recall that I have treated her for diverticulitis in the past.  She recently had her gall bladder removed, and that can sometimes cause changes in bowels.  If having ongoing GI complaints, schedule OV

## 2012-02-11 ENCOUNTER — Telehealth: Payer: Self-pay | Admitting: Family Medicine

## 2012-02-11 NOTE — Telephone Encounter (Signed)
DONE

## 2012-03-25 ENCOUNTER — Other Ambulatory Visit: Payer: Self-pay | Admitting: Family Medicine

## 2012-03-25 DIAGNOSIS — R002 Palpitations: Secondary | ICD-10-CM

## 2012-03-25 DIAGNOSIS — F900 Attention-deficit hyperactivity disorder, predominantly inattentive type: Secondary | ICD-10-CM

## 2012-03-25 DIAGNOSIS — K219 Gastro-esophageal reflux disease without esophagitis: Secondary | ICD-10-CM

## 2012-03-25 MED ORDER — ESOMEPRAZOLE MAGNESIUM 40 MG PO CPDR: 40.0000 mg | DELAYED_RELEASE_CAPSULE | Freq: Every day | ORAL | Status: DC

## 2012-03-25 MED ORDER — METOPROLOL SUCCINATE ER 50 MG PO TB24: 50.0000 mg | ORAL_TABLET | Freq: Every day | ORAL | Status: DC

## 2012-03-25 NOTE — Telephone Encounter (Signed)
Pt called and stated she needed refills on nexium, toprol and adderall 30 mg sent to [facility] [place] [phone]

## 2012-03-25 NOTE — Telephone Encounter (Signed)
Advise pt that nexium and toprol xl were sent to pharmacy.  Adderall needs to be picked up.  Please print (order pended) and advise pt when ready for pick up.

## 2012-03-26 ENCOUNTER — Telehealth: Payer: Self-pay | Admitting: *Deleted

## 2012-03-26 MED ORDER — AMPHETAMINE-DEXTROAMPHET ER 30 MG PO CP24: 30.0000 mg | ORAL_CAPSULE | ORAL | Status: DC

## 2012-03-26 NOTE — Telephone Encounter (Signed)
Called patient and let her know that rx's were called into pharmacy, except for the Adderall that she needs to pick up here at the office. Also she is due for med check ~ 04/15/12, please call our office and schedule med check or schedule when she comes to pick up Adderall rx.

## 2012-04-04 ENCOUNTER — Telehealth: Payer: Self-pay | Admitting: Family Medicine

## 2012-04-08 ENCOUNTER — Telehealth: Payer: Self-pay | Admitting: Family Medicine

## 2012-04-08 NOTE — Telephone Encounter (Signed)
LM

## 2012-05-19 ENCOUNTER — Encounter: Payer: Self-pay | Admitting: Family Medicine

## 2012-05-19 ENCOUNTER — Ambulatory Visit (INDEPENDENT_AMBULATORY_CARE_PROVIDER_SITE_OTHER): Payer: 59 | Admitting: Family Medicine

## 2012-05-19 VITALS — BP 140/84 | HR 80 | Ht 65.0 in | Wt 228.0 lb

## 2012-05-19 DIAGNOSIS — F329 Major depressive disorder, single episode, unspecified: Secondary | ICD-10-CM | POA: Insufficient documentation

## 2012-05-19 DIAGNOSIS — F988 Other specified behavioral and emotional disorders with onset usually occurring in childhood and adolescence: Secondary | ICD-10-CM

## 2012-05-19 DIAGNOSIS — F900 Attention-deficit hyperactivity disorder, predominantly inattentive type: Secondary | ICD-10-CM

## 2012-05-19 MED ORDER — BUPROPION HCL ER (XL) 150 MG PO TB24: 150.0000 mg | ORAL_TABLET | Freq: Every day | ORAL | Status: DC

## 2012-05-19 NOTE — Patient Instructions (Signed)
Start taking the Wellbutrin once daily in the mornings.  Continue to take the fluoxetine 40mg . I recommend counseling (can be short-term until the medications become effective). Daily exercise is encouraged

## 2012-05-19 NOTE — Progress Notes (Signed)
Chief Complaint  Patient presents with  . Advice Only    thinks prozac is not working well. Not sure if it is not working or due to other factors.  Also is med check for ADD  HPI: Depression:  Has been on 40mg  of Prozac for about 20 years, and she doesn't feel like it is working well anymore.  She will be changed from "weekend-option"  to "part-time" with only part-time benefits come this September (no longer getting the premium for working weekends, and will have decreased benefits), which is causing some stress.  Everyone she works with on the weekends is in same situation, which is stressful. She works extra hours at [employer], but these are not "scheduled hours" that would count towards full time.  Seems to be procrastinating more--bills aren't getting paid, not really caring about anything.  Doesn't want to get out of bed on the days she doesn't have to in order to work.  Symptoms of worsening depression for about a month, getting progresively worse.  Denies suicidal thoughts.  Doesn't want to be around people, talk on the phone.  Has been eating more, junk foods.  Not getting any exercise, unmotivated.    ADD:  Hasn't been taking her Adderall daily because she hasn't been in school.  Seems to feel better when she takes it.  Takes it on the days she works.  Plans to go back to school in the fall.  Has been taking just the long-acting Adderall in the mornings (has been off the 5mg  since out of school after working).  Denies significant insomnia, appetite changes or weight loss.  Thyroid was checked 1 year ago and was normal.  Skin is somewhat dry, but no different.  No hair loss, cold intolerance, no bowel changes.  Past Medical History  Diagnosis Date  . Allergy   . Asthma   . PCOS (polycystic ovarian syndrome)   . Cancer     VAGINAL/VULVAR  . Uterine fibroid   . Hyperlipidemia   . Vitamin d deficiency   . Fibromyalgia   . Migraine   . GERD (gastroesophageal reflux disease)   . HSV-2 (herpes  simplex virus 2) infection     PER BLOOD TEST  . ADD (attention deficit hyperactivity disorder, inattentive type)   . Depression   . Diverticulitis   . Abdominal  pain, other specified site   . Constipation   . Nausea and vomiting   . Pneumonia   . Bronchitis   . Anemia     had iron infusion  . H/O hiatal hernia     hx of   . PVC's (premature ventricular contractions)     DR.LITTLE, now followed by primary Dr.Wilmar Prabhakar [phone]   Past Surgical History  Procedure Date  . Squamous cell carcinoma excision 2010+2011    VULVAR CA EXCISION X 2 (DR HOLLAND)  . Sigmoidectomy 1997    DIVERTICULITS  . Colonoscopy 2000/2008  . Hernia repair 09/2010    VENTRAL  . Appendectomy   . Colon surgery   . Dilation and curettage of uterus   . Cholecystectomy 12/25/2011    Procedure: LAPAROSCOPIC CHOLECYSTECTOMY WITH INTRAOPERATIVE CHOLANGIOGRAM;  Surgeon: Rulon Abide, DO;  Location: [facility] OR;  Service: General;  Laterality: N/A;  Laparoscopic cholecystectomy with cholangiogram    History   Social History  . Marital Status: Single    Spouse Name: N/A    Number of Children: N/A  . Years of Education: N/A   Occupational History  .  Not on file.   Social History Main Topics  . Smoking status: Never Smoker   . Smokeless tobacco: Never Used  . Alcohol Use: Yes     social,1-2 drinks every other week  . Drug Use: No  . Sexually Active: Not Currently    Birth Control/ Protection: Pill   Other Topics Concern  . Not on file   Social History Narrative  . No narrative on file   Current Outpatient Prescriptions on File Prior to Visit  Medication Sig Dispense Refill  . albuterol (PROVENTIL HFA;VENTOLIN HFA) 108 (90 BASE) MCG/ACT inhaler Inhale 2 puffs into the lungs every 6 (six) hours as needed. For wheezing.      Marland Kitchen amphetamine-dextroamphetamine (ADDERALL XR) 30 MG 24 hr capsule Take 1 capsule (30 mg total) by mouth every morning.  90 capsule  0  . esomeprazole (NEXIUM) 40 MG capsule Take 1  capsule (40 mg total) by mouth daily before breakfast.  90 capsule  1  . FLUoxetine (PROZAC) 40 MG capsule Take 1 capsule (40 mg total) by mouth daily.  90 capsule  1  . metoprolol succinate (TOPROL XL) 50 MG 24 hr tablet Take 1 tablet (50 mg total) by mouth daily. Patient can only take brand name TOPROL XL--do not substitute generic  90 tablet  1  . Norethin-Eth Estrad-Fe Biphas (LO LOESTRIN FE) 1 MG-10 MCG / 10 MCG TABS Take 1 tablet by mouth daily.       Marland Kitchen amphetamine-dextroamphetamine (ADDERALL) 5 MG tablet Take 1 tablet (5 mg total) by mouth daily.  90 tablet  0  . buPROPion (WELLBUTRIN XL) 150 MG 24 hr tablet Take 1 tablet (150 mg total) by mouth daily.  90 tablet  0  . cholecalciferol (VITAMIN D) 1000 UNITS tablet Take 1,000 Units by mouth daily.        Allergies  Allergen Reactions  . Latex     blisters  . Codeine Hives, Itching and Rash   ROS:  +weight gain.  Denies fevers, URI symptoms, cough, shortness of breath, chest pain, GI complaints. +depression as per HPI.  Denies insomnia or other concerns.  PHYSICAL EXAM: BP 140/84  Pulse 80  Ht 5\' 5"  (1.651 m)  Wt 228 lb (103.42 kg)  BMI 37.94 kg/m2 Well developed, pleasant, overweight female who is intermittently crying Neck: no lymphadenopathy, thyromegaly or mass Heart: regular rate and rhythm Lungs: clear bilaterally Abdomen: soft, nontender, no mass Extremities: no edema Skin: no rash Psych: appears depressed, crying, but full range of affect.  Normal eye contact, speech, hygiene and grooming  ASSESSMENT/PLAN: 1. Depressive disorder, not elsewhere classified  buPROPion (WELLBUTRIN XL) 150 MG 24 hr tablet  2. ADD (attention deficit hyperactivity disorder, inattentive type)      Depression--continue Prozac 40 Add Wellbutrin XL 150mg  daily.  Side effects and risks reviewed. Encouraged to restart counseling.   ADD--doing well.  Doesn't need refill right now.  Schedule CPE, fasting (needs f/u lipids) Anemia--improved  after iron infusion, last checked in January, recheck at CPE.  30 minute visit, more than 1/2 counseling

## 2012-06-11 ENCOUNTER — Ambulatory Visit (INDEPENDENT_AMBULATORY_CARE_PROVIDER_SITE_OTHER): Payer: 59 | Admitting: Family Medicine

## 2012-06-11 ENCOUNTER — Encounter: Payer: Self-pay | Admitting: Family Medicine

## 2012-06-11 VITALS — BP 108/70 | HR 68 | Temp 97.9°F | Ht 65.0 in | Wt 224.0 lb

## 2012-06-11 DIAGNOSIS — K529 Noninfective gastroenteritis and colitis, unspecified: Secondary | ICD-10-CM

## 2012-06-11 DIAGNOSIS — K5289 Other specified noninfective gastroenteritis and colitis: Secondary | ICD-10-CM

## 2012-06-11 MED ORDER — PROMETHAZINE HCL 25 MG PO TABS: 25.0000 mg | ORAL_TABLET | Freq: Three times a day (TID) | ORAL | Status: DC | PRN

## 2012-06-11 NOTE — Patient Instructions (Signed)
Drink plenty of fluids.  Start with clear liquids, and advance diet as tolerated--avoiding spicy/greasy foods, and staying away from dairy products for about a week. Use Phenergan as needed for nausea--may cause sedation (can try 1/2 tablet during day, if needed). May use Imodium as needed to help with diarrhea.  Return if having high fevers, severe abdominal pain, bloody stools

## 2012-06-11 NOTE — Progress Notes (Signed)
Chief Complaint  Patient presents with  . Emesis    and diarrhea since yesterday morning accompanied by HA.   HPI: She worked over the weekend, and felt a little tired on Monday (which isn't uncommon after working weekends).  Woke up yesterday (Tuesday) with nausea and vomiting, and developed diarrhea during the day.  Vomited 4x yesterday, had about 8-9 episodes of diarrhea.  Denies hematemesis.  Diarrhea was watery, no blood.  One episode of diarrhea today, loose/semi-formed (not completely watery), and no vomiting, but has ongoing nausea.  Hasn't taken any OTC meds.  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 Had some intermittent abdominal pain, mainly at [employer], throughout the day yesterday.  Currently pain-free, but had some cramps (lower abdomen) prior to diarrhea this morning. Denies travel, camping, or recent ABX. Denies spoiled or undercooked foods.  Denies fevers.  Complains of headache above L eye--doesn't feel like a migraine, but feels like it could turn into one.  Using Excedrin Migraine and ice eye mask with some improvement. Currently headache 6/10.  Denies vision changes.  Having some itchy/water eyes due to her allergies.  Having some allergy symptoms--congestion, runny nose.  Denies discolored mucus or phlegm.  Some PND and ear plugging.   Past Medical History  Diagnosis Date  . Allergy   . Asthma   . PCOS (polycystic ovarian syndrome)   . Cancer     VAGINAL/VULVAR  . Uterine fibroid   . Hyperlipidemia   . Vitamin d deficiency   . Fibromyalgia   . Migraine   . GERD (gastroesophageal reflux disease)   . HSV-2 (herpes simplex virus 2) infection     PER BLOOD TEST  . ADD (attention deficit hyperactivity disorder, inattentive type)   . Depression   . Diverticulitis   . Abdominal  pain, other specified site   . Constipation   . Nausea and vomiting   . Pneumonia   . Bronchitis   . Anemia     had iron infusion  . H/O hiatal hernia     hx of   . PVC's (premature  ventricular contractions)     DR.LITTLE, now followed by primary Dr.Isiaah Cuervo [phone]   Past Surgical History  Procedure Date  . Squamous cell carcinoma excision 2010+2011    VULVAR CA EXCISION X 2 (DR HOLLAND)  . Sigmoidectomy 1997    DIVERTICULITS  . Colonoscopy 2000/2008  . Hernia repair 09/2010    VENTRAL  . Appendectomy   . Colon surgery   . Dilation and curettage of uterus   . Cholecystectomy 12/25/2011    Procedure: LAPAROSCOPIC CHOLECYSTECTOMY WITH INTRAOPERATIVE CHOLANGIOGRAM;  Surgeon: Rulon Abide, DO;  Location: [facility] OR;  Service: General;  Laterality: N/A;  Laparoscopic cholecystectomy with cholangiogram    History   Social History  . Marital Status: Single    Spouse Name: N/A    Number of Children: N/A  . Years of Education: N/A   Occupational History  . Not on file.   Social History Main Topics  . Smoking status: Never Smoker   . Smokeless tobacco: Never Used  . Alcohol Use: Yes     social,1-2 drinks every other week  . Drug Use: No  . Sexually Active: Not Currently    Birth Control/ Protection: Pill   Other Topics Concern  . Not on file   Social History Narrative  . No narrative on file   Current Outpatient Prescriptions on File Prior to Visit  Medication Sig Dispense Refill  .  amphetamine-dextroamphetamine (ADDERALL XR) 30 MG 24 hr capsule Take 1 capsule (30 mg total) by mouth every morning.  90 capsule  0  . buPROPion (WELLBUTRIN XL) 150 MG 24 hr tablet Take 1 tablet (150 mg total) by mouth daily.  90 tablet  0  . cholecalciferol (VITAMIN D) 1000 UNITS tablet Take 1,000 Units by mouth daily.       Marland Kitchen esomeprazole (NEXIUM) 40 MG capsule Take 1 capsule (40 mg total) by mouth daily before breakfast.  90 capsule  1  . FLUoxetine (PROZAC) 40 MG capsule Take 1 capsule (40 mg total) by mouth daily.  90 capsule  1  . metoprolol succinate (TOPROL XL) 50 MG 24 hr tablet Take 1 tablet (50 mg total) by mouth daily. Patient can only take brand name TOPROL XL--do  not substitute generic  90 tablet  1  . Norethin-Eth Estrad-Fe Biphas (LO LOESTRIN FE) 1 MG-10 MCG / 10 MCG TABS Take 1 tablet by mouth daily.       Marland Kitchen albuterol (PROVENTIL HFA;VENTOLIN HFA) 108 (90 BASE) MCG/ACT inhaler Inhale 2 puffs into the lungs every 6 (six) hours as needed. For wheezing.      Marland Kitchen amphetamine-dextroamphetamine (ADDERALL) 5 MG tablet Take 1 tablet (5 mg total) by mouth daily.  90 tablet  0   Allergies  Allergen Reactions  . Latex     blisters  . Codeine Hives, Itching and Rash   ROS: Denies fevers.  +n/v/d, +heartburn/reflux. Denies skin rash, edema.  +allergies.  See HPI.  Moods--improved on Wellbutrin, tolerating without side effects.  PHYSICAL EXAM: BP 108/70  Pulse 68  Temp 97.9 F (36.6 C) (Oral)  Ht 5\' 5"  (1.651 m)  Wt 224 lb (101.606 kg)  BMI 37.28 kg/m2 Well developed, pleasant female in no distress HEENT:  PERRL, EOMI, conjunctiva clear.  TM's and EAC's normal.  OP clear with moist mucus membranes.  Nasal mucosa normal.  Sinuses nontender Neck: no lymphadenopathy Heart: regular rate and rhythm without murmur Lungs: clear bilaterally Abdomen: soft, nontender.  Active bowel sound.  No organomegaly or mass Skin:  No rash Psych: normal mood, affect, hygiene and grooming  ASSESSMENT/PLAN:  1. AGE (acute gastroenteritis)  promethazine (PHENERGAN) 25 MG tablet   AGE--BRAT diet, avoid dairy for a week.  Advance diet as tolerated.  Discussed zofran vs phenergan.  Given that vomiting is decreased, and complaints of headache, will rx phenergan.  Can call for zofran ODT if needed. Risks/side effects of meds reviewed.  Allergies--stable, no evidence of sinus infection  Depression--improved on Wellbutrin  Note written--pt seen in office today for illness (missed exam today)

## 2012-06-16 ENCOUNTER — Ambulatory Visit: Payer: 59 | Admitting: Family Medicine

## 2012-06-25 ENCOUNTER — Other Ambulatory Visit: Payer: Self-pay | Admitting: *Deleted

## 2012-06-25 DIAGNOSIS — F329 Major depressive disorder, single episode, unspecified: Secondary | ICD-10-CM

## 2012-06-25 MED ORDER — FLUOXETINE HCL 40 MG PO CAPS: 40.0000 mg | ORAL_CAPSULE | Freq: Every day | ORAL | Status: DC

## 2012-08-04 ENCOUNTER — Encounter: Payer: 59 | Admitting: Family Medicine

## 2012-08-18 ENCOUNTER — Telehealth: Payer: Self-pay | Admitting: Family Medicine

## 2012-08-18 DIAGNOSIS — F329 Major depressive disorder, single episode, unspecified: Secondary | ICD-10-CM

## 2012-08-18 MED ORDER — BUPROPION HCL ER (XL) 150 MG PO TB24: 150.0000 mg | ORAL_TABLET | Freq: Every day | ORAL | Status: DC

## 2012-08-18 NOTE — Telephone Encounter (Signed)
Okay to refill x 3 months.  She no showed her CPE, where we were to f/u on her meds/depression.  reschedule

## 2012-08-18 NOTE — Telephone Encounter (Signed)
This is Dr. Delford Field partient

## 2012-08-18 NOTE — Telephone Encounter (Signed)
Done. Also left message for patient to please call and reschedule her CPE that was missed on 08/04/12.

## 2012-09-01 ENCOUNTER — Telehealth: Payer: Self-pay | Admitting: Family Medicine

## 2012-09-01 DIAGNOSIS — F900 Attention-deficit hyperactivity disorder, predominantly inattentive type: Secondary | ICD-10-CM

## 2012-09-01 MED ORDER — AMPHETAMINE-DEXTROAMPHET ER 30 MG PO CP24: 30.0000 mg | ORAL_CAPSULE | ORAL | Status: DC

## 2012-09-01 NOTE — Telephone Encounter (Signed)
Spoke with patient and she needs to adderall 30mg  XR #90-she will come and pick up. She would rather do a CPE with a med check so I adjusted the time to a 45 minute visit. Pt advised of the importance of not missing any more appts.

## 2012-09-01 NOTE — Telephone Encounter (Signed)
Printed / signed

## 2012-09-01 NOTE — Telephone Encounter (Signed)
Patient no-showed a CPE recently.  If her visit in January is to be BOTH a med check AND CPE, then needs to be 45 min (she has multiple issues to be followed up, and needs to be fasting); vs scheduling a med check separately from the CPE, which doesn't need to wait that long.  I can refill her Adderall--which does she need refilled, plain or XR? Or both? She was last rx'd #90 of both 5/10 and 4/30, respectively.  I'm assuming she wants 90 day rx.  She will not get further refills without appt (ie if she no shows or cancels again, will need med check separate from CPE)  Please see if she needs both

## 2012-09-15 ENCOUNTER — Telehealth: Payer: Self-pay | Admitting: Family Medicine

## 2012-09-15 ENCOUNTER — Other Ambulatory Visit: Payer: Self-pay | Admitting: Family Medicine

## 2012-09-15 DIAGNOSIS — K219 Gastro-esophageal reflux disease without esophagitis: Secondary | ICD-10-CM

## 2012-09-15 MED ORDER — OMEPRAZOLE 40 MG PO CPDR: 40.0000 mg | DELAYED_RELEASE_CAPSULE | Freq: Every day | ORAL | Status: DC

## 2012-09-15 NOTE — Telephone Encounter (Signed)
Okay with me, if okay with pt (look at fax to see who is requesting--if insurance requesting, must make sure is okay with pt)

## 2012-09-15 NOTE — Telephone Encounter (Signed)
Left message asking patient to call me back to let me know if this med change was okay with her.

## 2012-09-15 NOTE — Telephone Encounter (Signed)
Spoke with patient and she is ok with this change as she longer has insurance.

## 2012-10-01 ENCOUNTER — Telehealth: Payer: Self-pay | Admitting: Internal Medicine

## 2012-10-01 DIAGNOSIS — R002 Palpitations: Secondary | ICD-10-CM

## 2012-10-01 MED ORDER — METOPROLOL SUCCINATE ER 50 MG PO TB24: 50.0000 mg | ORAL_TABLET | Freq: Every day | ORAL | Status: DC

## 2012-10-01 NOTE — Telephone Encounter (Signed)
Done

## 2012-11-17 ENCOUNTER — Other Ambulatory Visit: Payer: Self-pay | Admitting: Family Medicine

## 2012-11-19 IMAGING — US US ABDOMEN COMPLETE
1 series · 13 of 25 positions shown · non-contrast
Comparison: CT abdomen pelvis of 12/13/2009 and abdomen films of
11/12/2011

CLINICAL DATA: Right upper quadrant abdominal pain, pain over
gallbladder upon compression clinically, nausea and vomiting

COMPLETE ABDOMINAL ULTRASOUND

[Series 1: us abdomen complete · 0.41mm/px · 13 of 66 slices shown]
[im 1/66]
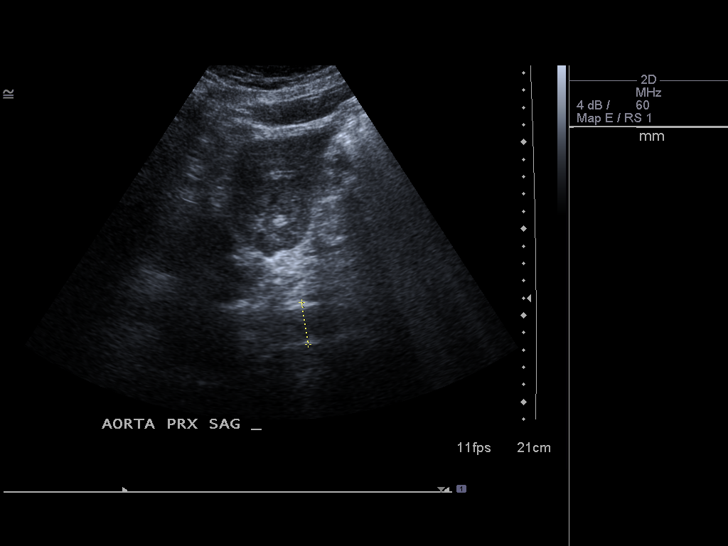
[im 6/66]
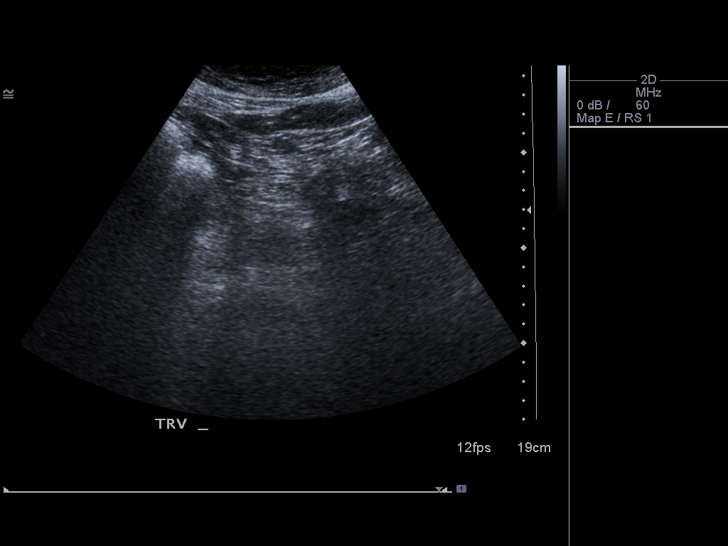
[im 11/66]
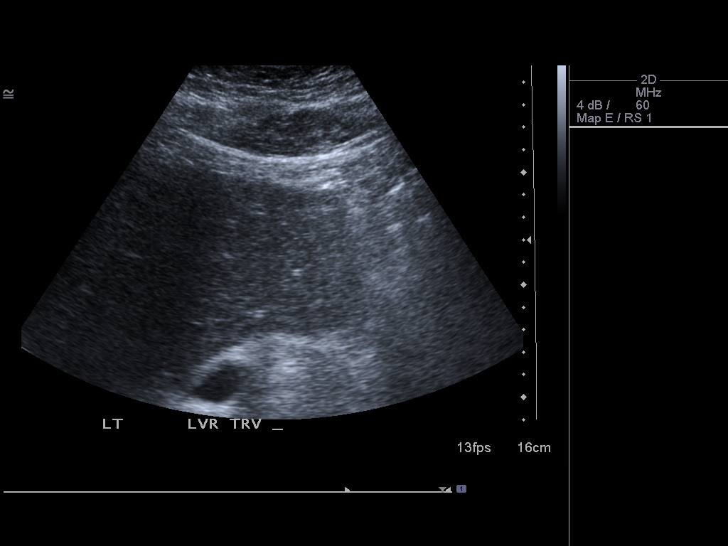
[im 17/66]
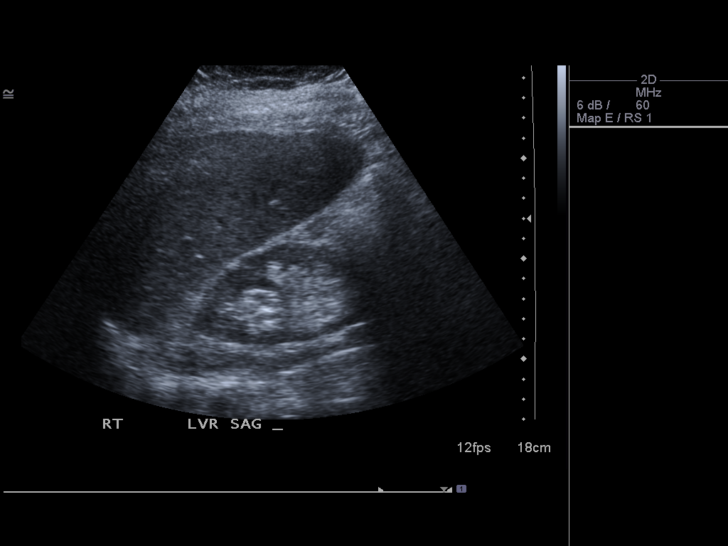
[im 22/66]
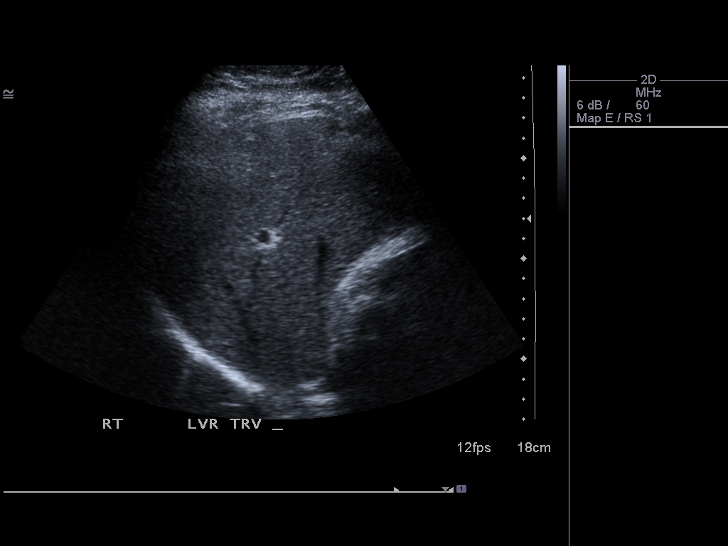
[im 28/66]
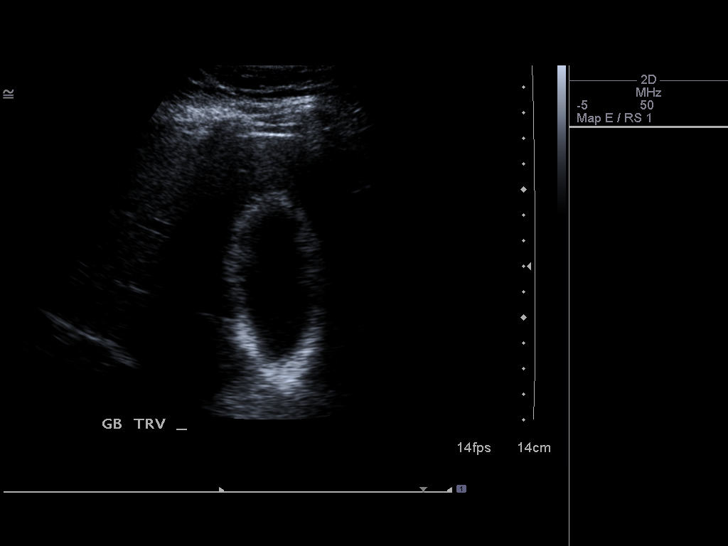
[im 33/66]
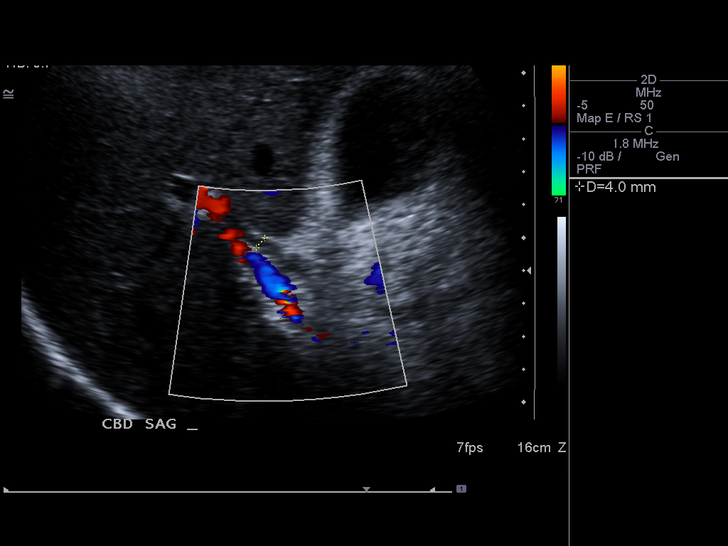
[im 38/66]
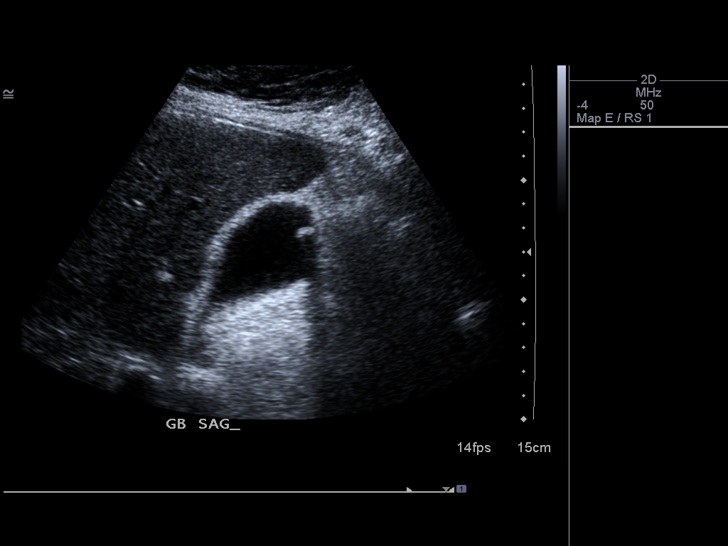
[im 44/66]
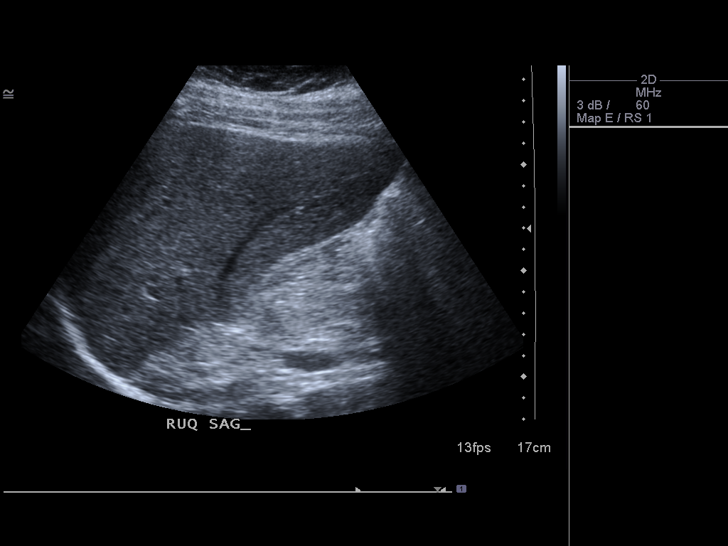
[im 49/66]
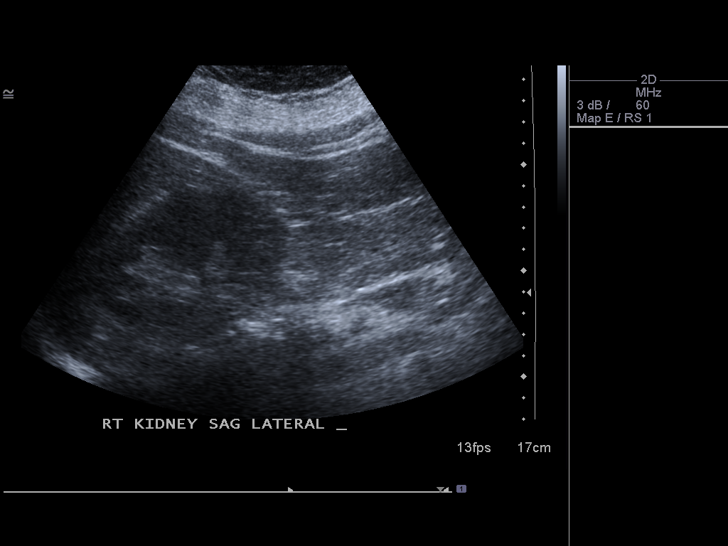
[im 55/66]
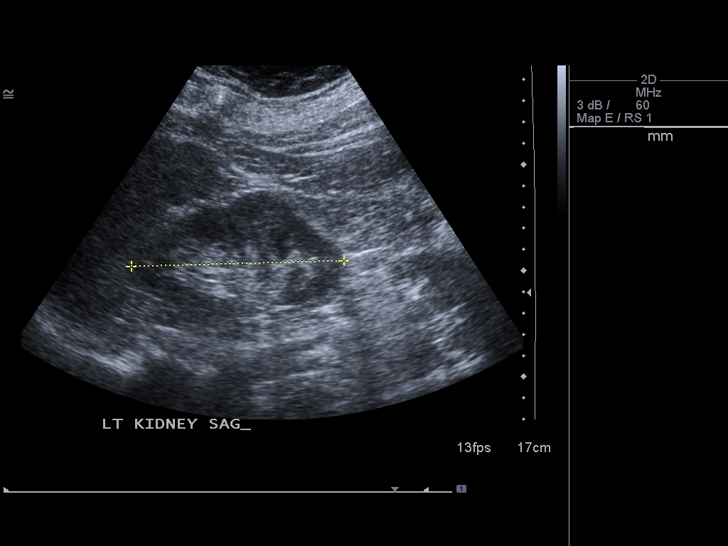
[im 60/66]
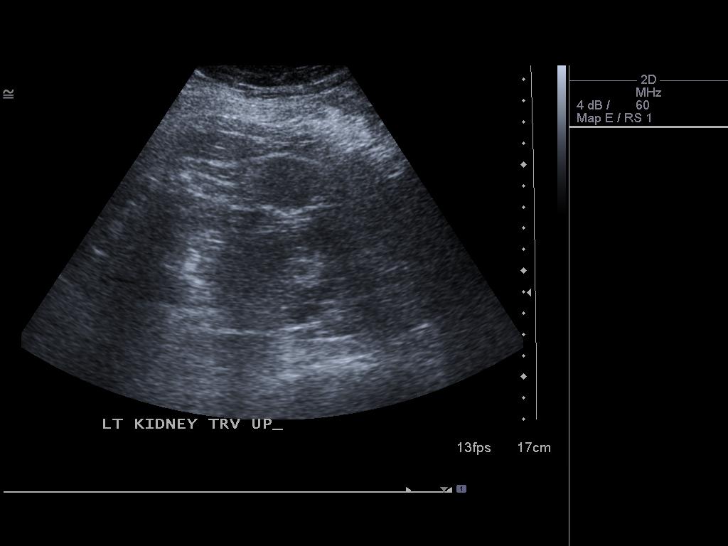
[im 66/66]
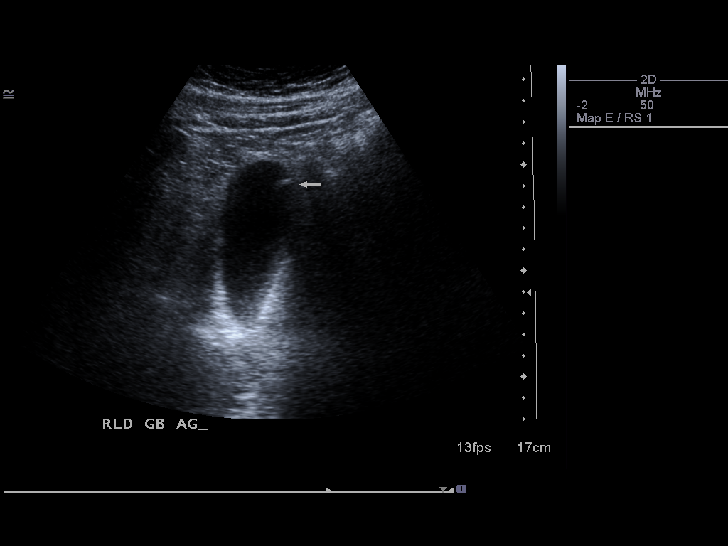

[13 of 25 positions shown; findings below may reference images not displayed]

FINDINGS: Gallbladder:  There is an echogenic focus in the fundus of the
gallbladder that does not move and does shadow, possibly
representing an adherent gallstone.  There is pain over gallbladder
with compression and the gallbladder wall is within upper limits at
3 mm in thickness.  Acute cholecystitis cannot be excluded.

Common bile duct:  The common bile duct is not optimally seen with
the best measurement being 4 mm in diameter.

Liver:  The liver has a normal echogenic pattern.  No ductal
dilatation is seen.

IVC:  The IVC is obscured by bowel gas.

Pancreas:  The pancreas also is partially obscured by bowel gas.

Spleen:  The spleen is normal measuring 7.4 cm sagittally.

Right Kidney:  No hydronephrosis is seen.  The right kidney
measures 10.4 cm sagittally.

Left Kidney:  No hydronephrosis.  The left kidney measures 10.0 cm.

Abdominal aorta:  The abdominal aorta is partially obscured by
bowel gas.  No aneurysmal dilatation is seen.
IMPRESSION: 1.  Probable 6 mm adherent gallstone with shadowing.  There is pain
over the gallbladder and the gallbladder wall is somewhat
prominent, suggesting possible early acute cholecystitis.
2.  No intrahepatic ductal dilatation.
3.  The pancreas is partially obscured by bowel gas.

## 2012-11-25 IMAGING — US US ABDOMEN COMPLETE
1 series · 13 of 25 positions shown · non-contrast
Comparison: Abdominal ultrasound performed 11/13/2011

CLINICAL DATA: Right upper quadrant abdominal pain and fever.
Worsening postprandial pain; recently diagnosed with gallstones.

ABDOMINAL ULTRASOUND COMPLETE

[Series 1: us abdomen complete · 0.33mm/px · 13 of 78 slices shown]
[im 1/78]
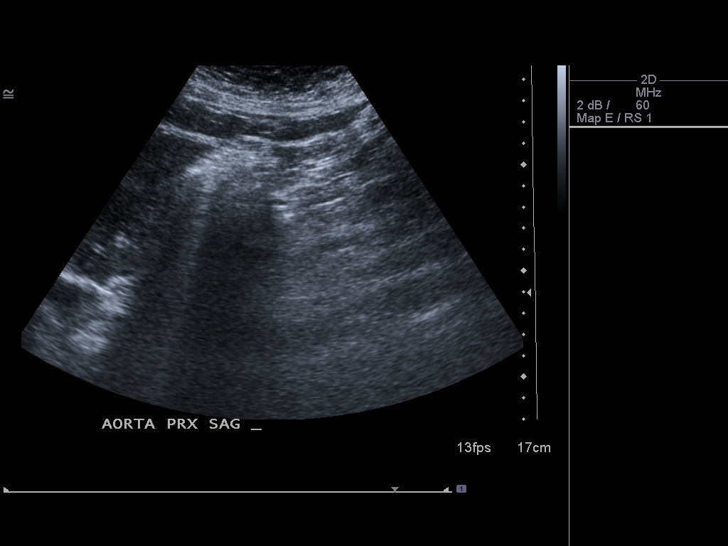
[im 7/78]
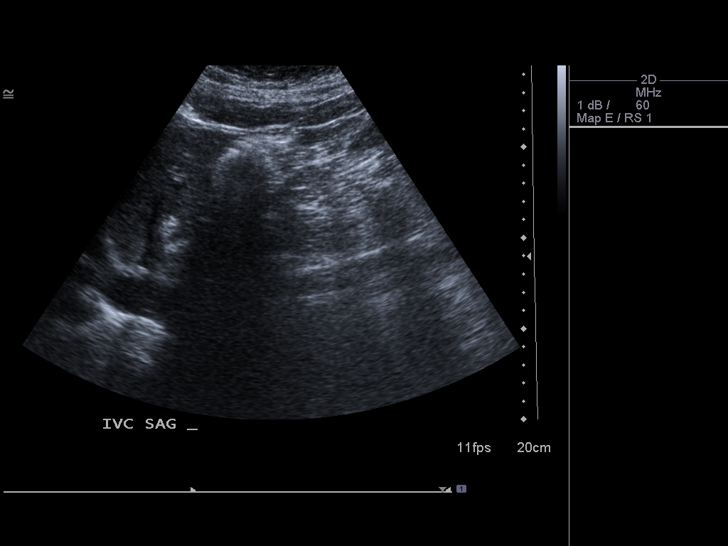
[im 13/78]
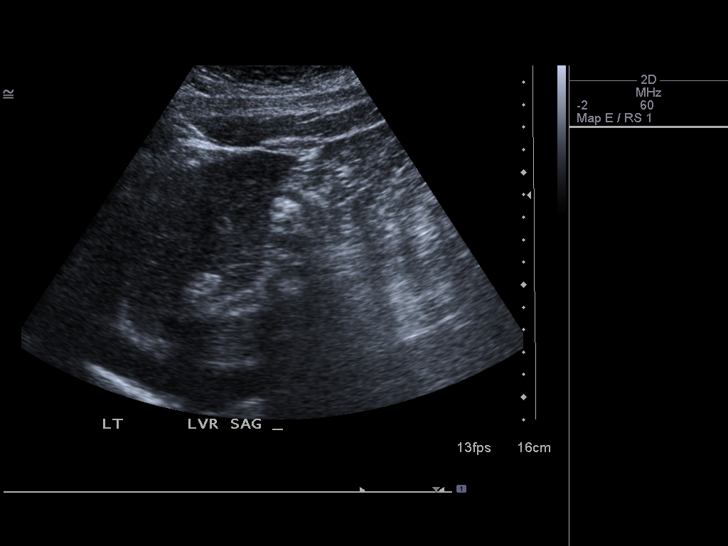
[im 20/78]
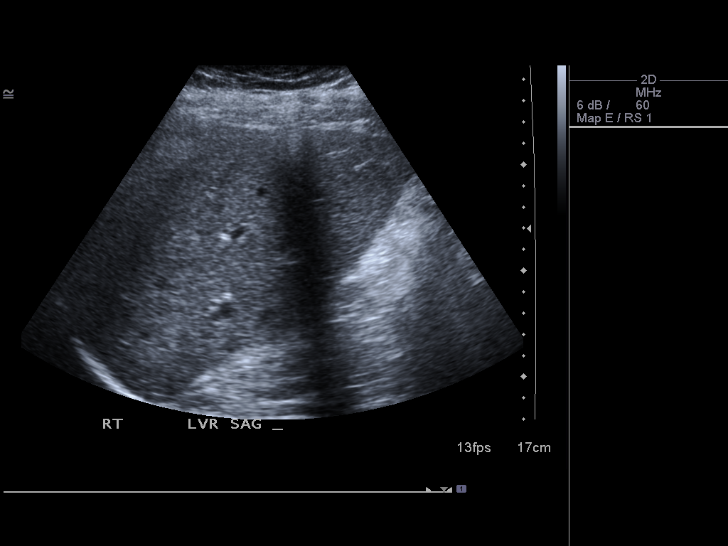
[im 26/78]
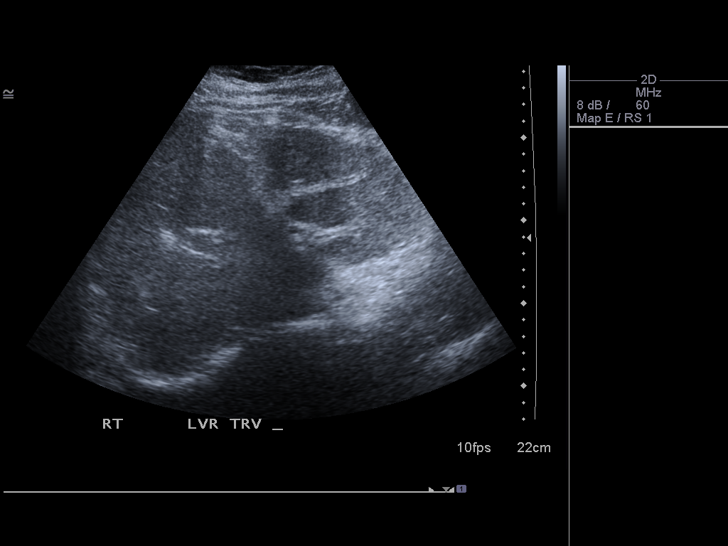
[im 33/78]
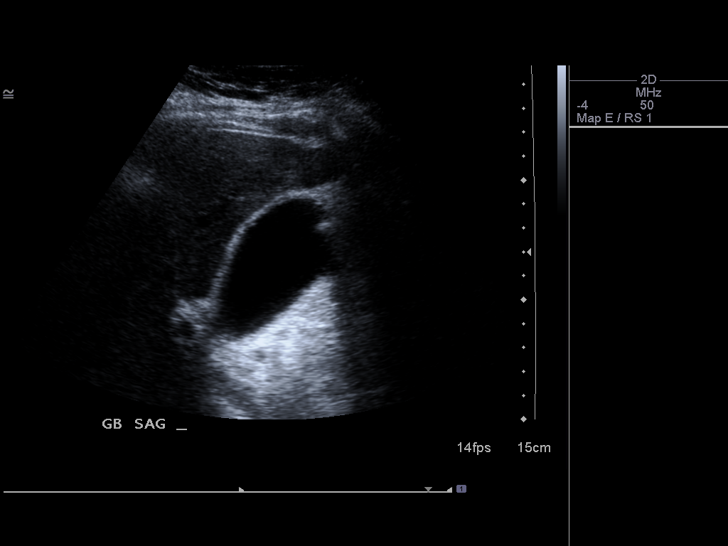
[im 39/78]
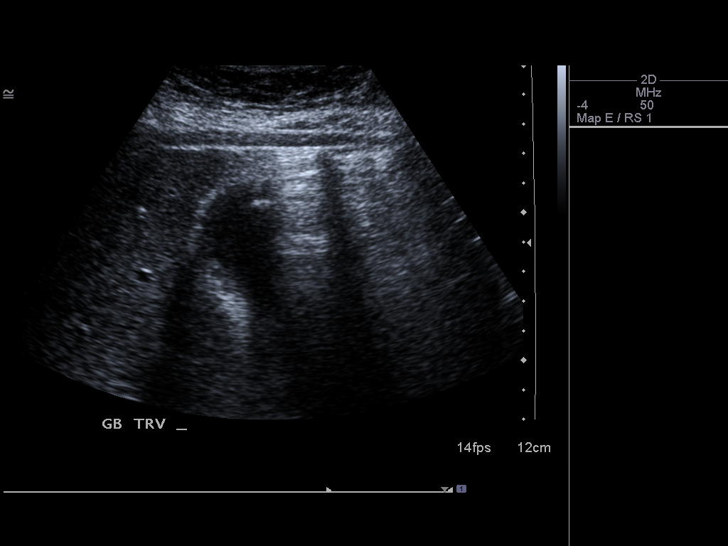
[im 45/78]
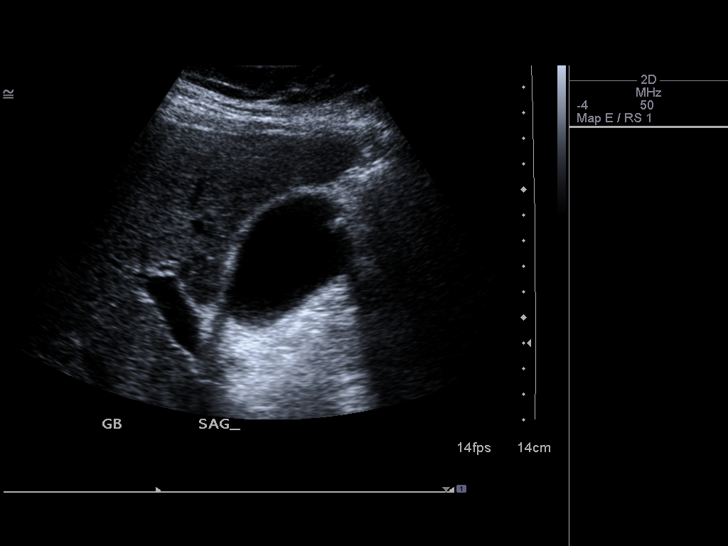
[im 52/78]
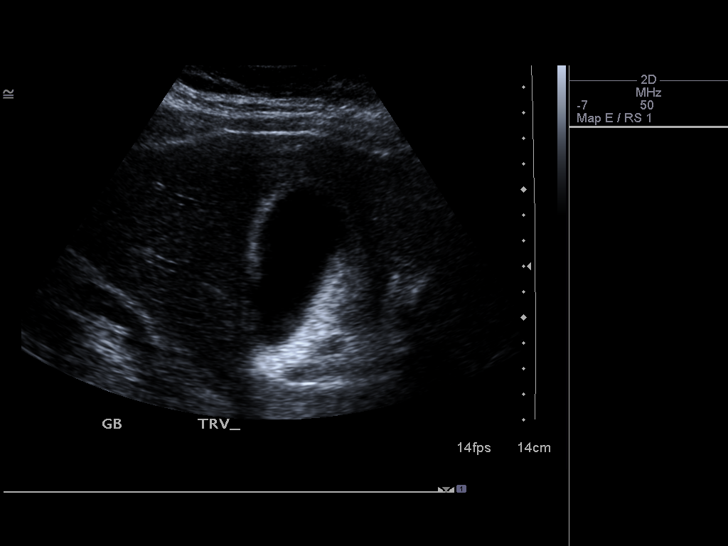
[im 58/78]
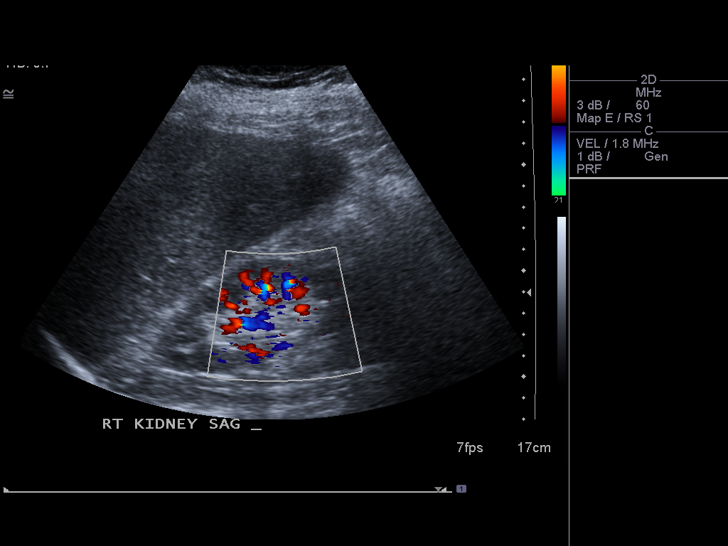
[im 65/78]
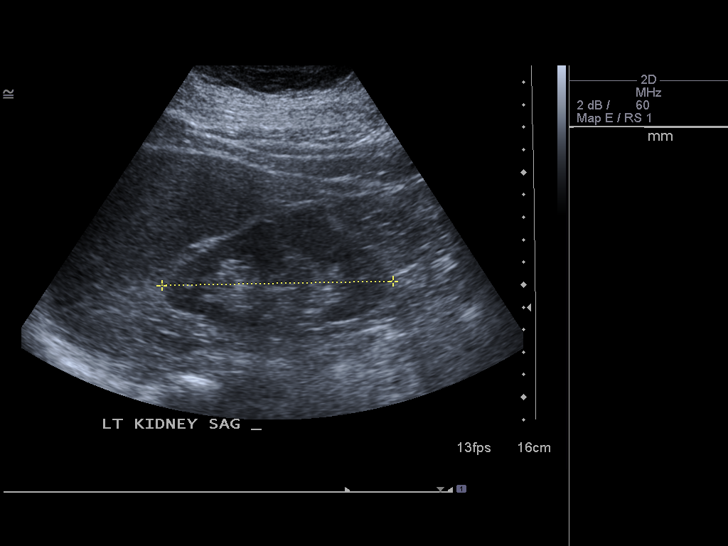
[im 71/78]
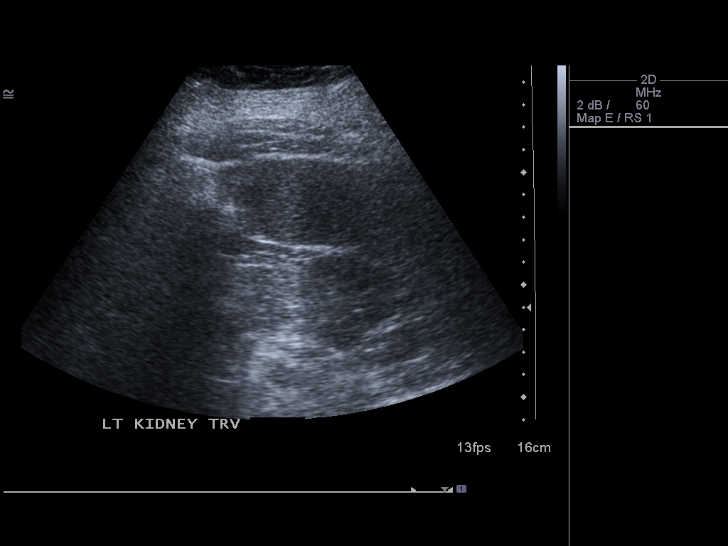
[im 78/78]
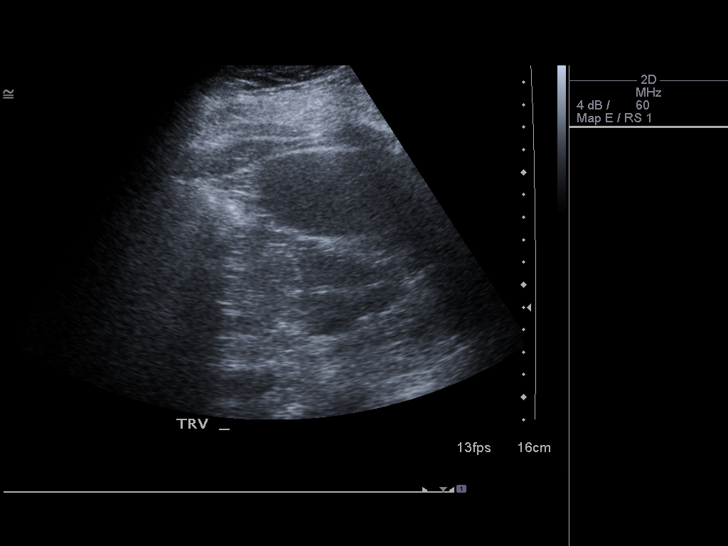

[13 of 25 positions shown; findings below may reference images not displayed]

FINDINGS: Gallbladder:  The gallbladder is unchanged in appearance.  A 7 mm
stone is again noted adherent to the fundus of the gallbladder.
There is mild gallbladder wall thickening, measuring 3 mm, with a
positive ultrasonographic Murphy's sign, as on the prior study.
This may reflect mild cholecystitis, as previously noted.  There is
no evidence for obstruction.

Common Bile Duct:  0.4 cm in diameter; within normal limits in
caliber.

Liver:  Normal parenchymal echogenicity and echotexture; no focal
lesions identified.  Limited Doppler evaluation demonstrates normal
blood flow within the liver.

IVC:  Unremarkable in appearance.

Pancreas:  Not well seen due to overlying bowel gas.

Spleen:  12.6 cm cm in length; borderline normal in size, with
normal echotexture.

Right kidney:  10.0 cm in length; normal in size, configuration and
parenchymal echogenicity.  No evidence of mass or hydronephrosis.

Left kidney:  10.3 cm in length; normal in size, configuration and
parenchymal echogenicity.  No evidence of mass or hydronephrosis.

Abdominal Aorta:  Normal in caliber; no aneurysm identified.
Difficult to fully characterize due to overlying bowel gas.
IMPRESSION: No significant change from the recent prior ultrasound performed
11/13/2011.  A 7 mm stone is again seen inferior to the fundus of
the gallbladder, and there is mild apparent gallbladder wall
thickening, with a positive ultrasonographic Murphy's sign, also
noted on the prior study.  This may reflect mild cholecystitis,
stable from the recent prior study.

## 2012-12-03 ENCOUNTER — Encounter: Payer: Self-pay | Admitting: Family Medicine

## 2012-12-17 ENCOUNTER — Ambulatory Visit: Payer: 59 | Admitting: Family Medicine

## 2012-12-18 ENCOUNTER — Ambulatory Visit (INDEPENDENT_AMBULATORY_CARE_PROVIDER_SITE_OTHER): Payer: Self-pay | Admitting: Family Medicine

## 2012-12-18 ENCOUNTER — Encounter: Payer: Self-pay | Admitting: Family Medicine

## 2012-12-18 VITALS — BP 126/78 | HR 72 | Temp 98.9°F | Ht 65.0 in | Wt 249.0 lb

## 2012-12-18 DIAGNOSIS — L02411 Cutaneous abscess of right axilla: Secondary | ICD-10-CM

## 2012-12-18 DIAGNOSIS — IMO0002 Reserved for concepts with insufficient information to code with codable children: Secondary | ICD-10-CM

## 2012-12-18 MED ORDER — DOXYCYCLINE HYCLATE 100 MG PO TABS: 100.0000 mg | ORAL_TABLET | Freq: Two times a day (BID) | ORAL | Status: DC

## 2012-12-18 NOTE — Patient Instructions (Signed)
Take doxycycline twice daily for 2 weeks.  Use the refill if it hasn't completely resolved, or to have on [place] for future recurrences of similar boils.  Change out your razor and deoderant.  If it doesn't get better with these antibiotics, may need to change, consider getting wound culture.  Community-Associated MRSA CA-MRSA stands for community-associated methicillin-resistant Staphylococcus aureus. MRSA is a type of bacteria that is resistant to some common antibiotics. It can cause infections in the skin and many other places in the body. Staphylococcus aureus, often called "staph," is a bacteria that normally lives on the skin or in the nose. Staph on the surface of the skin or in the nose does not cause problems. However, if the staph enters the body through a cut, wound, or break in the skin, an infection can happen. Up until recently, infections with the MRSA type of staph mainly occurred in hospitals and other healthcare settings. There are now increasing problems with MRSA infections in the community as well. Infections with MRSA may be very serious or even life-threatening. CA-MRSA is becoming more common. It is known to spread in crowded settings, in jails and prisons, and in situations where there is close skin-to-skin contact, such as during sporting events or in locker rooms. MRSA can be spread through shared items, such as children's toys, razors, towels, or sports equipment.  CAUSES All staph, including MRSA, are normally harmless unless they enter the body through a scratch, cut, or wound, such as with surgery. All staph, including MRSA, can be spread from person-to-person by touching contaminated objects or through direct contact.  MRSA now causes illness in people who have not been in hospitals or other healthcare facilities. Cases of MRSA diseases in the community have been associated with:  Recent antibiotic use.  Sharing contaminated towels or clothes.  Having active skin  diseases.  Participating in contact sports.  Living in crowded settings.  Intravenous (IV) drug use.  Community-associated MRSA infections are usually skin infections, but may cause other severe illnesses.  Staph bacteria are one of the most common causes of skin infection. However, they are also a common cause of pneumonia, bone or joint infections, and bloodstream infections. DIAGNOSIS Diagnosis of MRSA is done by cultures of fluid samples that may come from:  Swabs taken from cuts or wounds in infected areas.  Nasal swabs.  Saliva or deep cough specimens from the lungs (sputum).  Urine.  Blood. Many people are "colonized" with MRSA but have no signs of infection. This means that people carry the MRSA germ on their skin or in their nose and may never develop MRSA infection.  TREATMENT  Treatment varies and is based on how serious, how deep, or how extensive the infection is. For example:  Some skin infections, such as a small boil or abscess, may be treated by draining yellowish-white fluid (pus) from the site of the infection.  Deeper or more widespread soft tissue infections are usually treated with surgery to drain pus and with antibiotic medicine given by vein or by mouth. This may be recommended even if you are pregnant.  Serious infections may require a hospital stay. If antibiotics are given, they may be needed for several weeks. PREVENTION Because many people are colonized with staph, including MRSA, preventing the spread of the bacteria from person-to-person is most important. The best way to prevent the spread of bacteria and other germs is through proper [place] washing or by using alcohol-based [place] disinfectants. The following are other ways  to help prevent MRSA infection within community settings.   Wash your hands frequently with soap and water for at least 15 seconds. Otherwise, use alcohol-based [place] disinfectants when soap and water is not available.  Make sure  people who live with you wash their hands often, too.  Do not share personal items. For example, avoid sharing razors and other personal hygiene items, towels, clothing, and athletic equipment.  Wash and dry your clothes and bedding at the warmest temperatures recommended on the labels.  Keep wounds covered. Pus from infected sores may contain MRSA and other bacteria. Keep cuts and abrasions clean and covered with germ-free (sterile), dry bandages until they are healed.  If you have a wound that appears infected, ask your caregiver if a culture for MRSA and other bacteria should be done.  If you are breastfeeding, talk to your caregiver about MRSA. You may be asked to temporarily stop breastfeeding. HOME CARE INSTRUCTIONS   Take your antibiotics as directed. Finish them even if you start to feel better.  Avoid close contact with those around you as much as possible. Do not use towels, razors, toothbrushes, bedding, or other items that will be used by others.  To fight the infection, follow your caregiver's instructions for wound care. Wash your hands before and after changing your bandages.  If you have an intravascular device, such as a catheter, make sure you know how to care for it.  Be sure to tell any healthcare providers that you have MRSA so they are aware of your infection. SEEK IMMEDIATE MEDICAL CARE IF:  The infection appears to be getting worse. Signs include:  Increased warmth, redness, or tenderness around the wound site.  A red line that extends from the infection site.  A dark color in the area around the infection.  Wound drainage that is tan, yellow, or green.  A bad smell coming from the wound.  You feel sick to your stomach (nauseous) and throw up (vomit) or cannot keep medicine down.  You have a fever.  Your baby is older than 3 months with a rectal temperature of 102 F (38.9 C) or higher.  Your baby is 17 months old or younger with a rectal temperature  of 100.4 F (38 C) or higher.  You have difficulty breathing. MAKE SURE YOU:   Understand these instructions.  Will watch your condition.  Will get help right away if you are not doing well or get worse. Document Released: 02/15/2006 Document Revised: 02/04/2012 Document Reviewed: 02/15/2011 [facility] Patient Information 2013 [place], [place].

## 2012-12-18 NOTE — Progress Notes (Signed)
Chief Complaint  Patient presents with  . Cyst    multiple cysts under right arm x 3 weeks ago-painful.   HPI: Started getting one cyst/abscess under her R axilla about 3 weeks ago, and has been developing more in the same area.  Two of them have drained.  Denies fevers, but having increased fatigue.  She stopped shaving, doing warm compresses.  Also had one in August, and was told it was folliculitis, treated with warm compresses and it resolved on its own.  Then she had a large boil on her right lower abdomen.  She lanced it herself, and it eventually healed up (she didn't have insurance, so wasn't seen).    Past Medical History  Diagnosis Date  . Allergy   . Asthma   . PCOS (polycystic ovarian syndrome)   . Cancer     VAGINAL/VULVAR  . Uterine fibroid   . Hyperlipidemia   . Vitamin D deficiency   . Fibromyalgia   . Migraine   . GERD (gastroesophageal reflux disease)   . HSV-2 (herpes simplex virus 2) infection     PER BLOOD TEST  . ADD (attention deficit hyperactivity disorder, inattentive type)   . Depression   . Diverticulitis   . Abdominal  pain, other specified site   . Constipation   . Nausea and vomiting   . Pneumonia   . Bronchitis   . Anemia     had iron infusion  . H/O hiatal hernia     hx of   . PVC's (premature ventricular contractions)     DR.LITTLE, now followed by primary Dr.Tanasha Menees [phone]   History   Social History  . Marital Status: Single    Spouse Name: N/A    Number of Children: N/A  . Years of Education: N/A   Occupational History  . Not on file.   Social History Main Topics  . Smoking status: Never Smoker   . Smokeless tobacco: Never Used  . Alcohol Use: Yes     Comment: social,1-2 drinks every other week  . Drug Use: No  . Sexually Active: Not Currently    Birth Control/ Protection: Pill   Other Topics Concern  . Not on file   Social History Narrative   Currently unemployed (laid off from [employer])   Current outpatient  prescriptions:amphetamine-dextroamphetamine (ADDERALL XR) 30 MG 24 hr capsule, Take 1 capsule (30 mg total) by mouth every morning., Disp: 90 capsule, Rfl: 0;  buPROPion (WELLBUTRIN XL) 150 MG 24 hr tablet, TAKE 1 TABLET (150 MG TOTAL) BY MOUTH DAILY., Disp: 90 tablet, Rfl: 0;  cholecalciferol (VITAMIN D) 1000 UNITS tablet, Take 1,000 Units by mouth daily. , Disp: , Rfl:  FLUoxetine (PROZAC) 40 MG capsule, Take 1 capsule (40 mg total) by mouth daily., Disp: 90 capsule, Rfl: 1;  metoprolol succinate (TOPROL XL) 50 MG 24 hr tablet, Take 1 tablet (50 mg total) by mouth daily., Disp: 90 tablet, Rfl: 0;  Norethin-Eth Estrad-Fe Biphas (LO LOESTRIN FE) 1 MG-10 MCG / 10 MCG TABS, Take 1 tablet by mouth daily. , Disp: , Rfl:  omeprazole (PRILOSEC) 40 MG capsule, Take 1 capsule (40 mg total) by mouth daily., Disp: 90 capsule, Rfl: 0;  albuterol (PROVENTIL HFA;VENTOLIN HFA) 108 (90 BASE) MCG/ACT inhaler, Inhale 2 puffs into the lungs every 6 (six) hours as needed. For wheezing., Disp: , Rfl:   ROS:  Denies fevers, vomiting, abdominal pain, other skin rashes.  +fatigue.   PHYSICAL EXAM: BP 126/78  Pulse 72  Ht 5\' 5"  (1.651 m)  Wt 249 lb (112.946 kg)  BMI 41.44 kg/m2 Well developed, pleasant female in no distress R axilla--many scattered pustules, areas of erythema, some of which have already drained (and are peeling).  Some active areas of erythema and induration.  No abscesses with fluctuance.  +axillary lymphadenopathy noted below these skin lesions.  ASSESSMENT/PLAN:  1. Abscess of axilla, right    multiple.  suspect MRSA    Abscesses in R axilla--suspected MRSA.  Will treat with doxycycline.  Disease is fairly extensive so will treat x 2 weeks, and put a refill to use for future recurrence.  Change out razor and deoderant.

## 2012-12-30 ENCOUNTER — Other Ambulatory Visit: Payer: Self-pay | Admitting: Family Medicine

## 2013-01-05 ENCOUNTER — Other Ambulatory Visit: Payer: Self-pay | Admitting: Family Medicine

## 2013-01-05 DIAGNOSIS — F329 Major depressive disorder, single episode, unspecified: Secondary | ICD-10-CM

## 2013-01-05 NOTE — Telephone Encounter (Deleted)
Is this okay to refill? 

## 2013-01-13 ENCOUNTER — Telehealth: Payer: Self-pay | Admitting: Family Medicine

## 2013-01-13 NOTE — Telephone Encounter (Signed)
Advise pt to use refill

## 2013-01-13 NOTE — Telephone Encounter (Signed)
Patient advised to get refill and complete.

## 2013-02-20 ENCOUNTER — Other Ambulatory Visit: Payer: Self-pay | Admitting: Family Medicine

## 2013-03-06 ENCOUNTER — Telehealth: Payer: Self-pay | Admitting: Family Medicine

## 2013-03-06 MED ORDER — DOXYCYCLINE HYCLATE 100 MG PO TABS: 100.0000 mg | ORAL_TABLET | Freq: Two times a day (BID) | ORAL | Status: DC

## 2013-03-06 NOTE — Telephone Encounter (Signed)
She was seen for this in January, we we can just try refilling the med that she used in Jan and when she had recurrence in Feb.  Rx was sent--she will need OV if it doesn't resolve with ABX and warm compresses.  She has canceled (or missed) many appts.  Looks like she just r/s her appt for later this month to September.  She hasn't seen me since June 2013 other than for acute visits (twice, July and Jan), and I have been refilling her meds.  If it is a CPE in September, she needs to schedule a med check prior to that--I won't continue to prescribe her chronic meds for over a year without a med check

## 2013-03-06 NOTE — Telephone Encounter (Signed)
Pt was called and message was left informing pt of message from dr. Lynelle Doctor. PT MUST HAVE AN APPOINTMENT PRIOR TO SEPT CPE FOR A MED CHECK IN ORDER FOR DR KNAPP TO REFILLS MEDS.

## 2013-03-06 NOTE — Telephone Encounter (Signed)
Pt called and stated that she again has boils under her arm. She was offered an appt with another care provider and it was declined. She was hoping that something could be called in because you treated her for this before. Pt uses [facility]. Please inform pt if something is called in or she needs to make an appt.

## 2013-03-16 ENCOUNTER — Encounter: Payer: 59 | Admitting: Family Medicine

## 2013-04-13 ENCOUNTER — Encounter: Payer: Self-pay | Admitting: Family Medicine

## 2013-04-13 ENCOUNTER — Other Ambulatory Visit: Payer: Self-pay | Admitting: Family Medicine

## 2013-04-13 NOTE — Telephone Encounter (Signed)
She no showed her appt today, and will be discharged.  Give #30 of both meds and that's it.  She hasn't had a med check since 2012, visit 04/2012 was just for depression, and a couple of other acutes.  She either no-showed, or same day canceled all of her physicals and med checks.  She shouldn't stop the beta blocker abruptly, needs to be tapered.  I believe it was for PVC's (?maybe migraines too?), NOT for hypertension

## 2013-04-13 NOTE — Telephone Encounter (Signed)
These came in a few minutes ago, should I deny?

## 2013-04-22 ENCOUNTER — Other Ambulatory Visit: Payer: Self-pay | Admitting: Family Medicine

## 2013-04-22 NOTE — Telephone Encounter (Signed)
She has been discharged for no shows.  She can have #30, but no additional refills.  #30 only

## 2013-04-22 NOTE — Telephone Encounter (Signed)
Is this okay?

## 2013-05-13 ENCOUNTER — Other Ambulatory Visit: Payer: Self-pay | Admitting: Family Medicine

## 2013-05-13 ENCOUNTER — Other Ambulatory Visit: Payer: Self-pay | Admitting: *Deleted

## 2013-05-13 MED ORDER — METOPROLOL SUCCINATE ER 50 MG PO TB24: 50.0000 mg | ORAL_TABLET | Freq: Every day | ORAL | Status: DC

## 2013-05-13 NOTE — Telephone Encounter (Signed)
Returned call to patient on her cell.  She had called Tammy crying.  We have refilled her Toprol for 1 last time.  I reached her voice mail again. lmtrc if needed.

## 2013-06-12 ENCOUNTER — Telehealth: Payer: Self-pay | Admitting: Family Medicine

## 2013-06-12 DIAGNOSIS — K219 Gastro-esophageal reflux disease without esophagitis: Secondary | ICD-10-CM

## 2013-06-12 DIAGNOSIS — F329 Major depressive disorder, single episode, unspecified: Secondary | ICD-10-CM

## 2013-06-12 DIAGNOSIS — R002 Palpitations: Secondary | ICD-10-CM

## 2013-06-12 MED ORDER — BUPROPION HCL ER (XL) 150 MG PO TB24: ORAL_TABLET | ORAL | Status: AC

## 2013-06-12 MED ORDER — FLUOXETINE HCL 40 MG PO CAPS: ORAL_CAPSULE | ORAL | Status: AC

## 2013-06-12 MED ORDER — METOPROLOL SUCCINATE ER 50 MG PO TB24: 50.0000 mg | ORAL_TABLET | Freq: Every day | ORAL | Status: AC

## 2013-06-12 MED ORDER — OMEPRAZOLE 40 MG PO CPDR: DELAYED_RELEASE_CAPSULE | ORAL | Status: AC

## 2013-06-12 NOTE — Telephone Encounter (Signed)
Please advise pt that rx's were sent to [facility] to last until she sees her new MD. (note--no additional refills will be given, even if she ends up canceling or rescheduling that appointment)

## 2013-06-12 NOTE — Telephone Encounter (Signed)
Unfortunately, due to the controlled nature of Adderall, I will not be able to fill that medication for her.  (the last time this was discussed at a visit was 04/2012--she had 2 visits since that time that was for acute things--abscess, GI bug)

## 2013-06-12 NOTE — Telephone Encounter (Signed)
CALLED PT TO INFORM THE REFILLS SENT TO PHARMACY AND NO ADDITIONAL REFILLS WILL BE ABLE TO BE PROVIDED. SHE UNDERSTOOD AND ASKED THAT A MESSAGE GET BACK TO DR KNAPP TO THANK HER AND TO LET HER KNOW THAT SHE REALLY APPRECIATE HER HELP WITH THE REFILLS. PT FURTHER ASKED ABOUT THE ADDERALL REFILL. WILL SHE BE ABLE TO PICK UP A REFILL FOR ADDERALL TOO?

## 2013-06-16 NOTE — Telephone Encounter (Signed)
SPOKE TO PT TODAY TO EXPLAIN THAT DR KNAPP COULD NOT REFILL ADDERALL DUE TO CONTROLLED NATURE OF MED. PT UNDERSTOOD

## 2013-08-10 ENCOUNTER — Encounter: Payer: Self-pay | Admitting: Family Medicine

## 2013-09-01 DIAGNOSIS — Z0279 Encounter for issue of other medical certificate: Secondary | ICD-10-CM

## 2014-03-04 ENCOUNTER — Telehealth: Payer: Self-pay | Admitting: Internal Medicine

## 2014-03-04 NOTE — Telephone Encounter (Signed)
Faxed over medical records to Dr. Trilby Drummer on march 16th

## 2014-08-04 ENCOUNTER — Encounter: Payer: Self-pay | Admitting: Genetic Counselor

## 2014-08-04 ENCOUNTER — Ambulatory Visit (HOSPITAL_BASED_OUTPATIENT_CLINIC_OR_DEPARTMENT_OTHER): Payer: Self-pay | Admitting: Genetic Counselor

## 2014-08-04 ENCOUNTER — Ambulatory Visit: Payer: Self-pay

## 2014-08-04 DIAGNOSIS — Z803 Family history of malignant neoplasm of breast: Secondary | ICD-10-CM

## 2014-08-04 DIAGNOSIS — IMO0002 Reserved for concepts with insufficient information to code with codable children: Secondary | ICD-10-CM

## 2014-08-04 DIAGNOSIS — Z808 Family history of malignant neoplasm of other organs or systems: Secondary | ICD-10-CM

## 2014-08-04 DIAGNOSIS — Z8481 Family history of carrier of genetic disease: Secondary | ICD-10-CM | POA: Insufficient documentation

## 2014-08-04 DIAGNOSIS — Z8544 Personal history of malignant neoplasm of other female genital organs: Secondary | ICD-10-CM

## 2014-08-04 NOTE — Progress Notes (Signed)
Patient Name: Brenda Little Patient Age: 43 y.o. Encounter Date: 08/04/2014  Referring Physician: SELF  Primary Care Provider: Vikki Ports, MD   Ms. Brenda Little, a 43 y.o. female, is being seen at the [facility] due to a family history of breast cancer and a recently identified BRCA2 mutation in her family. She presents to clinic today to discuss genetic testing for this mutation and understand its implications for her. She is being seen with her mother who is also being offered testing today.  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   Past Medical History  Diagnosis Date  . Allergy   . Asthma   . PCOS (polycystic ovarian syndrome)   . Cancer     VAGINAL/VULVAR  . Uterine fibroid   . Hyperlipidemia   . Vitamin D deficiency   . Fibromyalgia   . Migraine   . GERD (gastroesophageal reflux disease)   . HSV-2 (herpes simplex virus 2) infection     PER BLOOD TEST  . ADD (attention deficit hyperactivity disorder, inattentive type)   . Depression   . Diverticulitis   . Abdominal pain, other specified site   . Constipation   . Nausea and vomiting   . Pneumonia   . Bronchitis   . Anemia     had iron infusion  . H/O hiatal hernia     hx of   . PVC's (premature ventricular contractions)     DR.LITTLE, now followed by primary Dr.Knapp [phone]  . FHx: BRCA2 gene positive     BRCA2 mutation in maternal aunt    Past Surgical History  Procedure Laterality Date  . 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 Sigmoidectomy  1997    DIVERTICULITS  . Colonoscopy  2000/2008  . Hernia repair  09/2010    VENTRAL  . Appendectomy    . Colon surgery    . Dilation and curettage of uterus    . Cholecystectomy  12/25/2011    Procedure: LAPAROSCOPIC CHOLECYSTECTOMY WITH INTRAOPERATIVE CHOLANGIOGRAM;  Surgeon: Brenda Keens, DO;  Location: [facility];  Service: General;  Laterality: N/A;  Laparoscopic  cholecystectomy with cholangiogram     History   Social History  . Marital Status: Single    Spouse Name: N/A    Number of Children: N/A  . Years of Education: N/A   Social History Main Topics  . Smoking status: Never Smoker   . Smokeless tobacco: Never Used  . Alcohol Use: Yes     Comment: social,1-2 drinks every other week  . Drug Use: No  . Sexual Activity: Not Currently    Birth Control/ Protection: Pill   Other Topics Concern  . Not on file   Social History Narrative   Currently unemployed (laid off from [employer])     FAMILY HISTORY:   During the visit, a 4-generation pedigree was obtained. Significant diagnoses include the following:  Family History  Problem Relation Age of Onset  . Hypertension Mother   . Fibromyalgia Mother   . Heart attack Father   . Sarcoidosis Father   . ADD / ADHD Sister   . Asthma Brother   . ADD / ADHD Brother   . Breast cancer Maternal Aunt 38    Recurrence 58; BRCA2 mutation  . Breast cancer Maternal Aunt 44    Currently 6; genetic testing pending  . Cancer Other  mat mat great uncle; head/neck ca in 31s  . Cancer Other     mat mat great grandmother; possible stomach cancer    Brenda Little's mother is cancer-free at 27 and declined genetic testing due to cost. Her maternal aunt who had breast cancer at age 35 was seen here and was found to have a BRCA2 pathogenic mutation. 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 Additionally, her maternal grandmother died at 81 cancer-free. Her maternal grandmother had one brother noted above as well as a sister (age 23s) who is cancer-free.   Brenda Little ancestry is Caucasian. There is no known Jewish ancestry and no consanguinity.  ASSESSMENT AND PLAN: Brenda Little is a 43 y.o. female with a family history of breast cancer and a pathogenic BRCA2 mutation identified in one of her maternal aunts. We reviewed the characteristics, features and  inheritance patterns of hereditary cancer syndromes, and the cancer risks associated with a BRCA2 mutation. We also discussed genetic testing, including the other appropriate family members to test, the process of testing, cost and implications of results. Clinically, it is recommended that her mother have testing first and if she's positive, for Brenda Little to proceed. Her mother, however, declined testing today due to cost.  Brenda Little wished to pursue genetic testing and a blood sample will be sent to [facility] for site-specific analysis of the familial BRCA2 mutation. We discussed the implications of a positive and negative result. Results should be available in approximately 2-3 weeks, at which point we will contact her and address implications for her as well as address genetic testing for at-risk family members, if needed.   We encouraged [name] to remain in contact with Cancer Genetics annually so that we can update the family history and inform her of any changes in cancer genetics and testing that may be of benefit for this family. Ms.  Little questions were answered to her satisfaction today.   Thank you for the referral and allowing [place] to share in the care of your patient.   The patient was seen for a total of 30 minutes, greater than 50% of which was spent face-to-face counseling. This patient was discussed with the overseeing provider who agrees with the above.   Brenda Berg, MS, [employer] Certified Genetic Counseor phone: [phone] [email or web address]

## 2014-08-23 ENCOUNTER — Encounter: Payer: Self-pay | Admitting: Genetic Counselor

## 2014-08-23 NOTE — Progress Notes (Signed)
GENETIC TEST RESULTS  Patient Name: Brenda Little Patient Age: 43 y.o. Encounter Date: 08/23/2014  Referring Physician: SELF  Primary Care Physician: Trilby Drummer   Ms. Parrillo was called today to discuss genetic test results. Please see the Genetics note from her visit on 08/04/14 for a detailed discussion of her personal and family history.  GENETIC TESTING: We recommended Ms. Borowiak pursue testing for the familial gene mutation called BRCA2, [id number] . Ms. Rossel's test, which was performed at [facility], did not reveal this mutation previously identified in her family. We call this result a true negative result because the cancer-causing mutation was identified in Ms. Brase's family, and she did not inherit it. Given this negative result, Ms. Calef's chances of developing BRCA2-related cancers are the same as they are in the general population.    CANCER SCREENING: Ms. Southgate is recommended to continue the cancer screening guidelines provided by her primary physician which includes a yearly mammogram, clinical breast exam and gynecologic exam. Colon cancer screening is recommended to begin at age 34 in the general population.   FAMILY MEMBERS: Even though Ms. Igo did not inherit the familial gene mutation, others in the family may have. It is very important all of Ms. Wymore's relatives (female and female) know of the presence of this mutation and that they may be at increased risk for cancer. Genetic testing can sort out who in your family is at risk and who is not.  Please let us know if we can help facilitate testing. Genetic counselors can be located in other cities, by visiting the website of the [employer] (ArtistMovie.[email or web address]) and Field seismologist for a Dietitian by zip code.  Lastly, we discussed with Ms. Wickes that cancer genetics is a rapidly advancing field and it is possible that new genetic tests will be appropriate for her in the future. We encouraged  her to remain in contact with [place] on an annual basis so we can update her personal and family histories, and let her know of advances in cancer genetics that may benefit the family. Our contact number was provided. Ms. Shimmin questions were answered to her satisfaction today, and she knows she is welcome to call anytime with additional questions.    Steele Berg, MS, [place] Certified Genetic Counseor phone: [phone] [email or web address]

## 2015-08-05 ENCOUNTER — Other Ambulatory Visit: Payer: Self-pay | Admitting: Obstetrics and Gynecology

## 2015-08-08 LAB — CYTOLOGY - PAP

## 2015-09-06 ENCOUNTER — Other Ambulatory Visit (HOSPITAL_COMMUNITY): Payer: Self-pay | Admitting: Obstetrics and Gynecology

## 2015-09-06 NOTE — H&P (Signed)
[facility]  DICTATION # [id number] CSN# [id number]   Margarette Asal, MD 09/06/2015 3:03 PM

## 2015-09-07 NOTE — H&P (Signed)
NAME:  Brenda Little, Brenda Little                   ACCOUNT NO.:  [id number]  MEDICAL RECORD NO.:  [id number]  LOCATION:                                FACILITY:  [facility]  PHYSICIAN:  Ralene Bathe. Matthew Little, M.D.DATE OF BIRTH:  10-23-1971  DATE OF ADMISSION:  09/21/2015 DATE OF DISCHARGE:                             HISTORY & PHYSICAL   CHIEF COMPLAINT:  Menorrhagia, dysmenorrhea, leiomyoma.  HISTORY OF PRESENT ILLNESS:  A 44 year old, G2, P0, on Loestrin for management of her menorrhagia and dysmenorrhea.  She is known to have small fibroids.  Recent followup sonohysterogram demonstrated fibroids 2.4, 2.0 and 1.3 cm.  Adnexa negative.  No free fluid.  On saline infusion either had a polyp or a small submucous fibroid.  We discussed several treatment options with her.  Her preference is for definitive hysterectomy with bilateral salpingectomy.  Her surgical history is significant for a previous sigmoid colectomy for diverticulitis.  She has also had a failed laparoscopic cholecystectomy and required laparotomy with a midline incision due to upper abdominal hernia repair with mesh, and she has also had a prior appendectomy.  We have recommended Pfannenstiel incision with TAH bilateral salpingectomy. Even then issues related to pelvic adhesions may make it necessary to consult intraoperatively General Surgery or consider supracervical hysterectomy for example.  This procedure including specific risks related to bleeding, infection, transfusion, adjacent organ injury, wound infection, phlebitis, along with her expected recovery time reviewed which she understands and accepts.  Last Pap September 2016 was normal.  PAST MEDICAL HISTORY:  ALLERGIES:  CODEINE.  CURRENT MEDICATIONS:  Toprol, Nexium, Prozac, albuterol, Chromagen iron, Xanax p.r.n., Wellbutrin, Adderall, Valtrex, and Loestrin.  SURGICAL HISTORY:  She has had a cholecystectomy, appendectomy, sigmoid colectomy.  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  FAMILY HISTORY:  Significant for thyroid disease, otherwise unremarkable.  SOCIAL HISTORY:  She is single.  Denies other drug or tobacco use.  She does drink alcohol socially.  PHYSICAL EXAMINATION:  VITAL SIGNS:  Temp 98.2, blood pressure 120/78. HEENT:  Unremarkable. NECK:  Supple without masses. LUNGS:  Clear. CARDIOVASCULAR:  Regular rate and rhythm without murmurs, rubs, or gallops. BREASTS:  Without masses. ABDOMEN:  Soft, flat, nontender.  Well-healed surgical scars midline and upper abdomen. GU:  Vulva was unremarkable.  Prior excision looks normal.  Vagina and cervix normal.  Uterus mid position, mobile, adnexa unremarkable.  IMPRESSION:  Menorrhagia and dysmenorrhea secondary to leiomyoma, failed conservative treatment with OCPs, presents now for total abdominal hysterectomy and bilateral salpingectomy.  Procedure and risks discussed as above.     Brenda Little, M.D.     RMH/MEDQ  D:  09/06/2015  T:  09/07/2015  Job:  [id number]

## 2015-09-14 ENCOUNTER — Encounter (HOSPITAL_COMMUNITY): Payer: Self-pay

## 2015-09-14 ENCOUNTER — Encounter (HOSPITAL_COMMUNITY)
Admission: RE | Admit: 2015-09-14 | Discharge: 2015-09-14 | Disposition: A | Discharge status: Home / Self Care | Payer: 59 | Source: Ambulatory Visit | Attending: Obstetrics and Gynecology | Admitting: Obstetrics and Gynecology

## 2015-09-14 DIAGNOSIS — N840 Polyp of corpus uteri: Secondary | ICD-10-CM | POA: Diagnosis not present

## 2015-09-14 DIAGNOSIS — N920 Excessive and frequent menstruation with regular cycle: Secondary | ICD-10-CM | POA: Diagnosis present

## 2015-09-14 DIAGNOSIS — Z01818 Encounter for other preprocedural examination: Secondary | ICD-10-CM | POA: Insufficient documentation

## 2015-09-14 DIAGNOSIS — D250 Submucous leiomyoma of uterus: Secondary | ICD-10-CM | POA: Diagnosis not present

## 2015-09-14 DIAGNOSIS — N946 Dysmenorrhea, unspecified: Secondary | ICD-10-CM | POA: Diagnosis not present

## 2015-09-14 DIAGNOSIS — K219 Gastro-esophageal reflux disease without esophagitis: Secondary | ICD-10-CM | POA: Diagnosis not present

## 2015-09-14 HISTORY — DX: Cardiac arrhythmia, unspecified: I49.9

## 2015-09-14 LAB — TYPE AND SCREEN
ABO/RH(D): O POS
ANTIBODY SCREEN: NEGATIVE

## 2015-09-14 LAB — CBC
HCT: 39 % (ref 36.0–46.0)
HEMOGLOBIN: 12.7 g/dL (ref 12.0–15.0)
MCH: 29.1 pg (ref 26.0–34.0)
MCHC: 32.6 g/dL (ref 30.0–36.0)
MCV: 89.4 fL (ref 78.0–100.0)
Platelets: 326 10*3/uL (ref 150–400)
RBC: 4.36 MIL/uL (ref 3.87–5.11)
RDW: 13.9 % (ref 11.5–15.5)
WBC: 13.5 10*3/uL — ABNORMAL HIGH (ref 4.0–10.5)

## 2015-09-14 LAB — ABO/RH: ABO/RH(D): O POS

## 2015-09-14 NOTE — Patient Instructions (Addendum)
Your procedure is scheduled on:  WEDNESDAY, September 21, 2015  Enter through the Main Entrance of [facility] at: 6:00 A.M.  Pick up the phone at the desk and dial [phone].  Call this number if you have problems the morning of surgery: [phone].  Remember: Do NOT eat food or drink after: MIDNIGHT Tuesday September 20, 2015 Take these medicines the morning of surgery with a SIP OF WATER: METOPROLOL, ADDERALL, WELLBUTRIN, PROZAC, OMEPRAZOLE  *BRING ASTHMA INHALERS DAY OF SURGERY  Do NOT wear jewelry (body piercing), metal hair clips/bobby pins, make-up, or nail polish. Do NOT wear lotions, powders, or perfumes.  You may wear deoderant. Do NOT shave for 48 hours prior to surgery. Do NOT bring valuables to the hospital. Contacts, dentures, or bridgework may not be worn into surgery. Leave suitcase in car.  After surgery it may be brought to your room.  For patients admitted to the hospital, checkout time is 11:00 AM the day of discharge.

## 2015-09-15 NOTE — H&P (Signed)
Brenda Little  DICTATION # [id number] CSN# [id number]   Margarette Asal, MD 09/15/2015 12:14 PM

## 2015-09-16 NOTE — H&P (Signed)
NAME:  Brenda Little, Brenda Little                   ACCOUNT NO.:  [id number]  MEDICAL RECORD NO.:  [id number]  LOCATION:  [facility]                           FACILITY:  [facility]  PHYSICIAN:  Ralene Bathe. Matthew Saras, M.D.DATE OF BIRTH:  12/07/1970  DATE OF ADMISSION:  09/14/2015 DATE OF DISCHARGE:  09/14/2015                             HISTORY & PHYSICAL   REFERRING PHYSICIAN:  Vikki Ports, M.D.  CHIEF COMPLAINT:  Dysmenorrhea, menorrhagia.  HISTORY OF PRESENT ILLNESS:  A 44 year old, G2, P0, on Loestrin with problems related to dysmenorrhea and menorrhagia who had recent evaluation with [facility] showing intramural fibroids 2.4, 2.0, and 13 mm. Endometrial biopsy was performed showing lower uterine segment, benign polyp, otherwise benign endometrial tissue.  On saline infusion ultrasound, she had a 12 mm density within the endometrium.  Initially, she was trying to get precertified for hysterectomy, but due to changing her mind and wanting more conservative treatment, presents at this time for D and C, hysteroscopy with NovaSure endometrial ablation.  This procedure including specific risks related to bleeding, infection, adjacent organ injury, and her expected recovery time.  All discussed with her, which she understands and accepts.  ALLERGIES:  LATEX AND CODEINE.  CURRENT MEDICATIONS:  Toprol, Xanax p.r.n., Nexium, Wellbutrin, Adderall, Valtrex, and Loestrin.  PAST SURGICAL HISTORY:  Appendectomy, sigmoid colectomy, hernia repair x2, cholecystectomy.  Her abdominal surgery was done for diverticulitis and she has had some mesh repair of a ventral hernia in her upper abdomen.  FAMILY HISTORY:  Significant for thyroid disease, otherwise negative.  SOCIAL HISTORY:  Denies drug or tobacco use.  She does drink alcohol socially.  She is single.  REVIEW OF SYSTEMS:  She had a history of VIN III that was excised with normal margins several years ago.  PHYSICAL EXAMINATION:  VITAL SIGNS:  Temp 98.2, blood pressure  118/72. HEENT:  Unremarkable. NECK:  Supple without masses. LUNGS:  Clear. CARDIOVASCULAR:  Regular rate and rhythm without murmurs, rubs, gallops noted. BREASTS:  Negative. ABDOMEN:  A well-healed incision from her prior midline surgery. PELVIC:  Vulva, vagina, and cervix normal.  Last Pap on September  16, was normal.  Uterus mid position, upper limit of normal size, mobile adnexa, unremarkable. EXTREMITIES:  Unremarkable. NEUROLOGIC:  Unremarkable.  IMPRESSION:  Menorrhagia with dysmenorrhea, small leiomyoma as noted on ultrasound, endometrial polyp versus small submucous fibroid.  PLAN:  D and C, hysteroscopy with MyoSure, NovaSure endometrial ablation.  Procedure and risks discussed as above.     Newell Wafer M. Matthew Saras, M.D.     RMH/MEDQ  D:  09/15/2015  T:  09/15/2015  Job:  [id number]

## 2015-09-20 ENCOUNTER — Encounter (HOSPITAL_COMMUNITY): Payer: Self-pay | Admitting: Anesthesiology

## 2015-09-20 MED ORDER — DEXTROSE 5 % IV SOLN
2.0000 g | INTRAVENOUS | Status: AC
  Administered 2015-09-21: 2 g via INTRAVENOUS
  Filled 2015-09-20: qty 2

## 2015-09-20 MED ORDER — DEXTROSE 5 % IV SOLN
2.0000 g | INTRAVENOUS | Status: DC
  Filled 2015-09-20: qty 2

## 2015-09-20 NOTE — Anesthesia Preprocedure Evaluation (Addendum)
Anesthesia Evaluation  Patient identified by MRN, date of birth, ID band Patient awake    Reviewed: Allergy & Precautions, NPO status , Patient's Chart, lab work & pertinent test results  Airway Mallampati: III  TM Distance: >3 FB Neck ROM: Full    Dental no notable dental hx. (+) Teeth Intact, Caps   Pulmonary asthma , pneumonia, resolved,    Pulmonary exam normal breath sounds clear to auscultation       Cardiovascular negative cardio ROS Normal cardiovascular exam+ dysrhythmias  Rhythm:Regular Rate:Normal     Neuro/Psych  Headaches, PSYCHIATRIC DISORDERS Depression  Neuromuscular disease    GI/Hepatic hiatal hernia, GERD  Medicated and Controlled,  Endo/Other  Obesity PCOS Hyperlipidemia  Renal/GU   negative genitourinary   Musculoskeletal  (+) Fibromyalgia -  Abdominal (+) + obese,   Peds  Hematology  (+) anemia ,   Anesthesia Other Findings   Reproductive/Obstetrics Menorrhagia Dysmenorrhea Fibroid uterus                              Anesthesia Physical Anesthesia Plan  ASA: III  Anesthesia Plan: General   Post-op Pain Management:    Induction: Intravenous  Airway Management Planned: LMA  Additional Equipment:   Intra-op Plan:   Post-operative Plan: Extubation in OR  Informed Consent: I have reviewed the patients History and Physical, chart, labs and discussed the procedure including the risks, benefits and alternatives for the proposed anesthesia with the patient or authorized representative who has indicated his/her understanding and acceptance.   Dental advisory given  Plan Discussed with: CRNA, Anesthesiologist and Surgeon  Anesthesia Plan Comments:         Anesthesia Quick Evaluation

## 2015-09-21 ENCOUNTER — Ambulatory Visit (HOSPITAL_COMMUNITY)
Admission: RE | Admit: 2015-09-21 | Discharge: 2015-09-21 | Disposition: A | Discharge status: Home / Self Care | Payer: 59 | Source: Ambulatory Visit | Attending: Obstetrics and Gynecology | Admitting: Obstetrics and Gynecology

## 2015-09-21 ENCOUNTER — Encounter (HOSPITAL_COMMUNITY): Payer: Self-pay

## 2015-09-21 ENCOUNTER — Encounter (HOSPITAL_COMMUNITY)
Admission: RE | Disposition: A | Discharge status: Home / Self Care | Payer: Self-pay | Source: Ambulatory Visit | Attending: Obstetrics and Gynecology

## 2015-09-21 ENCOUNTER — Ambulatory Visit (HOSPITAL_COMMUNITY): Payer: 59 | Admitting: Anesthesiology

## 2015-09-21 DIAGNOSIS — K219 Gastro-esophageal reflux disease without esophagitis: Secondary | ICD-10-CM | POA: Insufficient documentation

## 2015-09-21 DIAGNOSIS — D250 Submucous leiomyoma of uterus: Secondary | ICD-10-CM | POA: Insufficient documentation

## 2015-09-21 DIAGNOSIS — N840 Polyp of corpus uteri: Secondary | ICD-10-CM | POA: Insufficient documentation

## 2015-09-21 DIAGNOSIS — N920 Excessive and frequent menstruation with regular cycle: Secondary | ICD-10-CM | POA: Diagnosis not present

## 2015-09-21 DIAGNOSIS — N946 Dysmenorrhea, unspecified: Secondary | ICD-10-CM | POA: Insufficient documentation

## 2015-09-21 HISTORY — PX: DILATATION & CURETTAGE/HYSTEROSCOPY WITH MYOSURE: SHX6511

## 2015-09-21 LAB — PREGNANCY, URINE: PREG TEST UR: NEGATIVE

## 2015-09-21 SURGERY — DILATATION & CURETTAGE/HYSTEROSCOPY WITH MYOSURE
Anesthesia: General

## 2015-09-21 MED ORDER — LIDOCAINE HCL (CARDIAC) 20 MG/ML IV SOLN
INTRAVENOUS | Status: DC | PRN
  Administered 2015-09-21: 50 mg via INTRAVENOUS

## 2015-09-21 MED ORDER — SCOPOLAMINE 1 MG/3DAYS TD PT72
1.0000 | MEDICATED_PATCH | TRANSDERMAL | Status: DC
  Administered 2015-09-21: 1.5 mg via TRANSDERMAL

## 2015-09-21 MED ORDER — MIDAZOLAM HCL 2 MG/2ML IJ SOLN
INTRAMUSCULAR | Status: AC
  Filled 2015-09-21: qty 4

## 2015-09-21 MED ORDER — LIDOCAINE HCL 1 % IJ SOLN
INTRAMUSCULAR | Status: DC | PRN
  Administered 2015-09-21: 10 mL

## 2015-09-21 MED ORDER — ONDANSETRON HCL 4 MG/2ML IJ SOLN
INTRAMUSCULAR | Status: DC | PRN
  Administered 2015-09-21: 4 mg via INTRAVENOUS

## 2015-09-21 MED ORDER — KETOROLAC TROMETHAMINE 30 MG/ML IJ SOLN
INTRAMUSCULAR | Status: AC
  Filled 2015-09-21: qty 1

## 2015-09-21 MED ORDER — EPHEDRINE SULFATE 50 MG/ML IJ SOLN
INTRAMUSCULAR | Status: DC | PRN
  Administered 2015-09-21: 5 mg via INTRAVENOUS

## 2015-09-21 MED ORDER — PROPOFOL 10 MG/ML IV BOLUS
INTRAVENOUS | Status: DC | PRN
  Administered 2015-09-21: 200 mg via INTRAVENOUS

## 2015-09-21 MED ORDER — FENTANYL CITRATE (PF) 100 MCG/2ML IJ SOLN
INTRAMUSCULAR | Status: AC
  Filled 2015-09-21: qty 4

## 2015-09-21 MED ORDER — MIDAZOLAM HCL 2 MG/2ML IJ SOLN
INTRAMUSCULAR | Status: DC | PRN
  Administered 2015-09-21: 2 mg via INTRAVENOUS

## 2015-09-21 MED ORDER — FENTANYL CITRATE (PF) 100 MCG/2ML IJ SOLN
25.0000 ug | INTRAMUSCULAR | Status: DC | PRN
  Administered 2015-09-21 (×2): 50 ug via INTRAVENOUS

## 2015-09-21 MED ORDER — HYDROCODONE-IBUPROFEN 7.5-200 MG PO TABS: 1.0000 | ORAL_TABLET | Freq: Three times a day (TID) | ORAL | Status: DC | PRN

## 2015-09-21 MED ORDER — SODIUM CHLORIDE 0.9 % IR SOLN
Status: DC | PRN
  Administered 2015-09-21: 3000 mL

## 2015-09-21 MED ORDER — KETOROLAC TROMETHAMINE 30 MG/ML IJ SOLN
INTRAMUSCULAR | Status: DC | PRN
  Administered 2015-09-21: 30 mg via INTRAVENOUS

## 2015-09-21 MED ORDER — LIDOCAINE HCL 1 % IJ SOLN
INTRAMUSCULAR | Status: AC
  Filled 2015-09-21: qty 20

## 2015-09-21 MED ORDER — METOCLOPRAMIDE HCL 5 MG/ML IJ SOLN: 10.0000 mg | Freq: Once | INTRAMUSCULAR | Status: DC | PRN

## 2015-09-21 MED ORDER — FENTANYL CITRATE (PF) 100 MCG/2ML IJ SOLN
INTRAMUSCULAR | Status: AC
  Filled 2015-09-21: qty 2

## 2015-09-21 MED ORDER — ONDANSETRON HCL 4 MG/2ML IJ SOLN
INTRAMUSCULAR | Status: AC
  Filled 2015-09-21: qty 2

## 2015-09-21 MED ORDER — DEXAMETHASONE SODIUM PHOSPHATE 4 MG/ML IJ SOLN
INTRAMUSCULAR | Status: AC
Start: 2015-09-21 — End: 2015-09-21
  Filled 2015-09-21: qty 1

## 2015-09-21 MED ORDER — DEXAMETHASONE SODIUM PHOSPHATE 10 MG/ML IJ SOLN
INTRAMUSCULAR | Status: DC | PRN
  Administered 2015-09-21: 4 mg via INTRAVENOUS

## 2015-09-21 MED ORDER — PROPOFOL 10 MG/ML IV BOLUS
INTRAVENOUS | Status: AC
  Filled 2015-09-21: qty 20

## 2015-09-21 MED ORDER — EPHEDRINE 5 MG/ML INJ
INTRAVENOUS | Status: AC
  Filled 2015-09-21: qty 10

## 2015-09-21 MED ORDER — FENTANYL CITRATE (PF) 100 MCG/2ML IJ SOLN
INTRAMUSCULAR | Status: DC | PRN
  Administered 2015-09-21 (×2): 50 ug via INTRAVENOUS

## 2015-09-21 MED ORDER — LACTATED RINGERS IV SOLN
INTRAVENOUS | Status: DC
  Administered 2015-09-21: 07:00:00 via INTRAVENOUS

## 2015-09-21 MED ORDER — LACTATED RINGERS IV SOLN
INTRAVENOUS | Status: DC
Start: 2015-09-21 — End: 2015-09-21
  Administered 2015-09-21: 125 mL/h via INTRAVENOUS

## 2015-09-21 MED ORDER — SCOPOLAMINE 1 MG/3DAYS TD PT72
MEDICATED_PATCH | TRANSDERMAL | Status: AC
  Filled 2015-09-21: qty 1

## 2015-09-21 MED ORDER — IBUPROFEN 800 MG PO TABS: 800.0000 mg | ORAL_TABLET | Freq: Three times a day (TID) | ORAL | Status: DC | PRN

## 2015-09-21 MED ORDER — MEPERIDINE HCL 25 MG/ML IJ SOLN: 6.2500 mg | INTRAMUSCULAR | Status: DC | PRN

## 2015-09-21 SURGICAL SUPPLY — 20 items
ABLATOR ENDOMETRIAL BIPOLAR (ABLATOR) ×2 IMPLANT
CATH FOLEY LATEX FREE 14FR (CATHETERS) ×3
CATH FOLEY LF 14FR (CATHETERS) IMPLANT
CATH ROBINSON RED A/P 16FR (CATHETERS) ×1 IMPLANT
CLOTH BEACON ORANGE TIMEOUT ST (SAFETY) ×3 IMPLANT
CONTAINER PREFILL 10% NBF 60ML (FORM) ×4 IMPLANT
DEVICE MYOSURE LITE (MISCELLANEOUS) IMPLANT
DEVICE MYOSURE REACH (MISCELLANEOUS) ×2 IMPLANT
FILTER ARTHROSCOPY CONVERTOR (FILTER) ×3 IMPLANT
GLOVE BIO SURGEON STRL SZ7 (GLOVE) ×6 IMPLANT
GLOVE LATEX FREE NEOLON SZ 8 (GLOVE) ×4 IMPLANT
GLOVE NEODERM STER SZ 7 (GLOVE) ×2 IMPLANT
GOWN STRL REUS W/TWL LRG LVL3 (GOWN DISPOSABLE) ×6 IMPLANT
PACK VAGINAL MINOR WOMEN LF (CUSTOM PROCEDURE TRAY) ×3 IMPLANT
PAD OB MATERNITY 4.3X12.25 (PERSONAL CARE ITEMS) ×3 IMPLANT
SEAL ROD LENS SCOPE MYOSURE (ABLATOR) ×3 IMPLANT
TOWEL OR 17X24 6PK STRL BLUE (TOWEL DISPOSABLE) ×6 IMPLANT
TUBING AQUILEX INFLOW (TUBING) ×3 IMPLANT
TUBING AQUILEX OUTFLOW (TUBING) ×3 IMPLANT
WATER STERILE IRR 1000ML POUR (IV SOLUTION) ×3 IMPLANT

## 2015-09-21 NOTE — Anesthesia Procedure Notes (Signed)
Procedure Name: LMA Insertion Date/Time: 09/21/2015 7:25 AM Performed by: Bufford Spikes Pre-anesthesia Checklist: Patient identified, Patient being monitored, Emergency Drugs available, Timeout performed and Suction available Patient Re-evaluated:Patient Re-evaluated prior to inductionOxygen Delivery Method: Circle system utilized Preoxygenation: Pre-oxygenation with 100% oxygen Intubation Type: IV induction Ventilation: Mask ventilation without difficulty LMA: LMA inserted LMA Size: 4.0 Grade View: Grade II Tube type: Oral Number of attempts: 1 Placement Confirmation: positive ETCO2 and breath sounds checked- equal and bilateral Tube secured with: Tape Dental Injury: Teeth and Oropharynx as per pre-operative assessment

## 2015-09-21 NOTE — Transfer of Care (Signed)
Immediate Anesthesia Transfer of Care Note  Patient: Brenda Little  Procedure(s) Performed: Procedure(s): DILATATION & CURETTAGE/HYSTEROSCOPY WITH MYOSURE AND NOVASURE (N/A)  Patient Location: PACU  Anesthesia Type:General  Level of Consciousness: awake, alert  and oriented  Airway & Oxygen Therapy: Patient Spontanous Breathing and Patient connected to nasal cannula oxygen  Post-op Assessment: Report given to RN and Post -op Vital signs reviewed and stable  Post vital signs: Reviewed and stable  Last Vitals:  Filed Vitals:   09/21/15 0638  BP: 118/83  Pulse: 68  Temp: 36.8 C  Resp: 16    Complications: No apparent anesthesia complications

## 2015-09-21 NOTE — Discharge Instructions (Signed)

## 2015-09-21 NOTE — Anesthesia Postprocedure Evaluation (Signed)
  Anesthesia Post-op Note  Patient: Brenda Little  Procedure(s) Performed: Procedure(s): DILATATION & CURETTAGE/HYSTEROSCOPY WITH MYOSURE AND NOVASURE (N/A)  Patient Location: PACU  Anesthesia Type:General  Level of Consciousness: awake, alert  and oriented  Airway and Oxygen Therapy: Patient Spontanous Breathing  Post-op Pain: none  Post-op Assessment: Post-op Vital signs reviewed, Patient's Cardiovascular Status Stable, Respiratory Function Stable, Patent Airway, No signs of Nausea or vomiting and Pain level controlled              Post-op Vital Signs: Reviewed and stable  Last Vitals:  Filed Vitals:   09/21/15 0805  BP: 130/82  Pulse: 64  Temp: 37 C  Resp: 18    Complications: No apparent anesthesia complications

## 2015-09-21 NOTE — Progress Notes (Signed)
The patient was re-examined with no change in status 

## 2015-09-21 NOTE — Op Note (Signed)
Preoperative diagnosis: Menorrhagia, endometrial polyp versus submucous fibroid.  Postoperative diagnosis: Same  Procedure: Hysteroscopy with Meiser resection of endometrial polyp/submucous fibroid, NovaSure endometrial ablation  Surgeon: Matthew Saras  Anesthesia: Gen.  EBL: Less than 50 cc  Specimens removed: Resected submucous fibroid/endometrial polyp  Procedure and findings:  Patient taken the operating room after an adequate level of general anesthesia was obtained with the patient's legs in stirrups the perineum and vagina were prepped and draped in the usual fashion and the bladder was drained. Appropriate timeouts were taken at that point. EUA was carried out the uterus was midposition, upper limit normal size, adnexa negative. Cervix grasped with tenaculum paracervical block was then created by infiltrating at 3 and 9:00 submucosally, 5-7 cc 1% plain Xylocaine at each site after negative aspiration. Uterus sounded to 9 cm cervical length of 3.0 cm, progressively dilated to 26-27 Pratt dilator, continuous flow hysteroscope was inserted well-defined posterior wall apparent submucous fibroid versus endometrial polyp was noted this was completely resected down to the level of surrounding tissue no other abnormalities were noted. Of this was hemostatic. The scope was removed, the NovaSure device was position with with a 4.0 cavity length 6.0 passing the CO2 test, with a subsequent treatment cycle. She tolerated this well receive Toradol at the end of the case and went to PACU in good condition  Dr. Molli Posey  dictated with [facility]

## 2015-09-23 ENCOUNTER — Encounter (HOSPITAL_COMMUNITY): Payer: Self-pay | Admitting: Obstetrics and Gynecology

## 2015-12-16 MED FILL — MONTELUKAST SOD 10 MG TAB: 10 | 30 days supply | Qty: 30 | Fill #0

## 2015-12-16 MED FILL — VALACYCLOVIR HCL 500 MG TAB: 500 | 30 days supply | Qty: 60 | Fill #0

## 2015-12-16 MED FILL — LEVOCETIRIZINE 5 MG TABLET: 5 | 30 days supply | Qty: 30 | Fill #0

## 2015-12-21 MED FILL — DEXTROAMP-AMPHET ER 30 MG C: 30 | 30 days supply | Qty: 30 | Fill #0

## 2015-12-26 MED FILL — BUPROPION HCL XL 150 MG TAB: 150 | 90 days supply | Qty: 90 | Fill #0

## 2015-12-26 MED FILL — OMEPRAZOLE DR 40 MG CAPSULE: 40 | 90 days supply | Qty: 90 | Fill #0

## 2016-01-19 MED FILL — FLUoxetine HCL 40 MG CAPS: 40 | 90 days supply | Qty: 90 | Fill #1

## 2016-01-19 MED FILL — MONTELUKAST SOD 10 MG TAB: 10 | 30 days supply | Qty: 30 | Fill #1

## 2016-01-19 MED FILL — LEVOCETIRIZINE 5 MG TABLET: 5 | 30 days supply | Qty: 30 | Fill #1

## 2016-01-19 MED FILL — VALACYCLOVIR HCL 500 MG TAB: 500 | 30 days supply | Qty: 60 | Fill #1

## 2016-01-26 MED FILL — METOPROLOL SUCC ER 50 MG TA: 50 | 90 days supply | Qty: 90 | Fill #1

## 2016-01-26 MED FILL — DEXTROAMP-AMPHET ER 30 MG C: 30 | 30 days supply | Qty: 30 | Fill #0

## 2016-02-21 MED FILL — MONTELUKAST SOD 10 MG TAB: 10 | 30 days supply | Qty: 30 | Fill #2

## 2016-02-21 MED FILL — VALACYCLOVIR HCL 500 MG TAB: 500 | 30 days supply | Qty: 60 | Fill #2

## 2016-02-21 MED FILL — LEVOCETIRIZINE 5 MG TABLET: 5 | 30 days supply | Qty: 30 | Fill #2

## 2016-03-05 MED FILL — DEXTROAMP-AMPHET ER 30 MG C: 30 | 30 days supply | Qty: 30 | Fill #0

## 2016-03-23 MED FILL — VALACYCLOVIR HCL 500 MG TAB: 500 | 30 days supply | Qty: 60 | Fill #3

## 2016-03-23 MED FILL — FLUoxetine HCL 40 MG CAPS: 40 | 30 days supply | Qty: 30 | Fill #0

## 2016-03-23 MED FILL — MONTELUKAST SOD 10 MG TAB: 10 | 30 days supply | Qty: 30 | Fill #0

## 2016-03-23 MED FILL — BUPROPION HCL XL 150 MG TAB: 150 | 30 days supply | Qty: 30 | Fill #1

## 2016-03-23 MED FILL — LEVOCETIRIZINE 5 MG TABLET: 5 | 30 days supply | Qty: 30 | Fill #0

## 2016-03-23 MED FILL — OMEPRAZOLE DR 40 MG CAPSULE: 40 | 30 days supply | Qty: 30 | Fill #1

## 2016-04-09 MED FILL — DEXTROAMP-AMPHET ER 30 MG C: 30 | 30 days supply | Qty: 30 | Fill #0

## 2016-04-24 MED FILL — METOPROLOL SUCC ER 50 MG TA: 50 | 30 days supply | Qty: 30 | Fill #0

## 2016-04-24 MED FILL — MONTELUKAST SOD 10 MG TAB: 10 | 30 days supply | Qty: 30 | Fill #1

## 2016-04-24 MED FILL — LEVOCETIRIZINE 5 MG TABLET: 5 | 30 days supply | Qty: 30 | Fill #1

## 2016-04-24 MED FILL — FLUoxetine HCL 40 MG CAPS: 40 | 30 days supply | Qty: 30 | Fill #1

## 2016-04-24 MED FILL — BUPROPION HCL XL 150 MG TAB: 150 | 30 days supply | Qty: 30 | Fill #2

## 2016-04-24 MED FILL — VALACYCLOVIR HCL 500 MG TAB: 500 | 30 days supply | Qty: 60 | Fill #4

## 2016-04-24 MED FILL — OMEPRAZOLE DR 40 MG CAPSULE: 40 | 30 days supply | Qty: 30 | Fill #2

## 2016-05-09 MED FILL — DEXTROAMP-AMPHET ER 30 MG C: 30 | 30 days supply | Qty: 30 | Fill #0

## 2016-05-11 MED FILL — IBUPROFEN 800 MG TABLET: 800 | 10 days supply | Qty: 30 | Fill #0

## 2016-05-11 MED FILL — OXYCODONE/APAP 5-325: 5-325 | 5 days supply | Qty: 30 | Fill #0

## 2016-05-22 MED FILL — MONTELUKAST SOD 10 MG TAB: 10 | 30 days supply | Qty: 30 | Fill #2

## 2016-05-22 MED FILL — PROAIR HFA 90 MCG INHALER: 108 (90 BAS | 30 days supply | Qty: 9 | Fill #0

## 2016-05-22 MED FILL — METOPROLOL SUCC ER 50 MG TA: 50 | 30 days supply | Qty: 30 | Fill #1

## 2016-05-22 MED FILL — FLUoxetine HCL 40 MG CAPS: 40 | 30 days supply | Qty: 30 | Fill #2

## 2016-05-22 MED FILL — BUPROPION HCL XL 150 MG TAB: 150 | 30 days supply | Qty: 30 | Fill #3

## 2016-05-22 MED FILL — VALACYCLOVIR HCL 500 MG TAB: 500 | 30 days supply | Qty: 60 | Fill #5

## 2016-05-22 MED FILL — OMEPRAZOLE DR 40 MG CAPSULE: 40 | 30 days supply | Qty: 30 | Fill #3

## 2016-05-22 MED FILL — LEVOCETIRIZINE 5 MG TABLET: 5 | 30 days supply | Qty: 30 | Fill #2

## 2016-06-06 MED FILL — DEXTROAMP-AMPHET ER 30 MG C: 30 | 30 days supply | Qty: 30 | Fill #0

## 2016-06-06 MED FILL — AMPHETAMINE SALTS 5 MG TAB: 5 | 30 days supply | Qty: 30 | Fill #0

## 2016-06-22 MED FILL — OMEPRAZOLE DR 40 MG CAPSULE: 40 | 30 days supply | Qty: 30 | Fill #0

## 2016-06-22 MED FILL — FLUoxetine HCL 40 MG CAPS: 40 | 30 days supply | Qty: 30 | Fill #3

## 2016-06-22 MED FILL — VALACYCLOVIR HCL 500 MG TAB: 500 | 30 days supply | Qty: 60 | Fill #0

## 2016-06-22 MED FILL — BUPROPION HCL XL 150 MG TAB: 150 | 30 days supply | Qty: 30 | Fill #0

## 2016-06-22 MED FILL — METOPROLOL SUCC ER 50 MG TA: 50 | 30 days supply | Qty: 30 | Fill #2

## 2016-06-22 MED FILL — MONTELUKAST SOD 10 MG TAB: 10 | 30 days supply | Qty: 30 | Fill #0

## 2016-06-22 MED FILL — LEVOCETIRIZINE 5 MG TABLET: 5 | 30 days supply | Qty: 30 | Fill #0

## 2016-07-12 MED FILL — DEXTROAMP-AMPHET ER 30 MG C: 30 | 30 days supply | Qty: 30 | Fill #0

## 2016-07-12 MED FILL — AMPHETAMINE SALTS 5 MG TAB: 5 | 30 days supply | Qty: 30 | Fill #0

## 2016-07-24 MED FILL — FLUoxetine HCL 40 MG CAPS: 40 | 30 days supply | Qty: 30 | Fill #4

## 2016-07-24 MED FILL — BUPROPION HCL XL 150 MG TAB: 150 | 30 days supply | Qty: 30 | Fill #1

## 2016-07-24 MED FILL — OMEPRAZOLE DR 40 MG CAPSULE: 40 | 30 days supply | Qty: 30 | Fill #1

## 2016-07-24 MED FILL — METOPROLOL SUCC ER 50 MG TA: 50 | 30 days supply | Qty: 30 | Fill #3

## 2016-07-24 MED FILL — LEVOCETIRIZINE 5 MG TABLET: 5 | 30 days supply | Qty: 30 | Fill #0

## 2016-07-24 MED FILL — MONTELUKAST SOD 10 MG TAB: 10 | 30 days supply | Qty: 30 | Fill #0

## 2016-07-24 MED FILL — VALACYCLOVIR HCL 500 MG TAB: 500 | 30 days supply | Qty: 60 | Fill #1

## 2016-08-20 MED FILL — DEXTROAMP-AMPHETAMINE 5 MG: 5 | 30 days supply | Qty: 30 | Fill #0

## 2016-08-20 MED FILL — DEXTROAMP-AMPHET ER 30 MG C: 30 | 30 days supply | Qty: 30 | Fill #0

## 2016-08-22 MED FILL — OMEPRAZOLE DR 40 MG CAPSULE: 40 | 30 days supply | Qty: 30 | Fill #2

## 2016-08-22 MED FILL — METOPROLOL SUCC ER 50 MG TA: 50 | 30 days supply | Qty: 30 | Fill #4

## 2016-08-22 MED FILL — BUPROPION HCL XL 150 MG TAB: 150 | 30 days supply | Qty: 30 | Fill #2

## 2016-08-22 MED FILL — FLUoxetine HCL 40 MG CAPS: 40 | 30 days supply | Qty: 30 | Fill #5

## 2016-08-22 MED FILL — VALACYCLOVIR HCL 500 MG TAB: 500 | 30 days supply | Qty: 60 | Fill #2

## 2016-09-20 MED FILL — FLUoxetine HCL 40 MG CAPS: 40 | 30 days supply | Qty: 30 | Fill #0

## 2016-09-20 MED FILL — METOPROLOL SUCC ER 50 MG TA: 50 | 30 days supply | Qty: 30 | Fill #5

## 2016-09-20 MED FILL — VALACYCLOVIR HCL 500 MG TAB: 500 | 30 days supply | Qty: 60 | Fill #3

## 2016-09-20 MED FILL — OMEPRAZOLE DR 40 MG CAPSULE: 40 | 30 days supply | Qty: 30 | Fill #3

## 2016-09-20 MED FILL — BUPROPION HCL XL 150 MG TAB: 150 | 30 days supply | Qty: 30 | Fill #3

## 2016-09-24 MED FILL — DEXTROAMP-AMPHETAMINE 5 MG: 5 | 30 days supply | Qty: 30 | Fill #0

## 2016-09-24 MED FILL — DEXTROAMP-AMPHET ER 30 MG C: 30 | 30 days supply | Qty: 30 | Fill #0

## 2016-10-23 MED FILL — OMEPRAZOLE DR 40 MG CAPSULE: 40 | 30 days supply | Qty: 30 | Fill #4

## 2016-10-23 MED FILL — BUPROPION HCL XL 150 MG TAB: 150 | 30 days supply | Qty: 30 | Fill #4

## 2016-10-23 MED FILL — METOPROLOL SUCC ER 50 MG TA: 50 | 30 days supply | Qty: 30 | Fill #0

## 2016-10-23 MED FILL — FLUoxetine HCL 40 MG CAPS: 40 | 30 days supply | Qty: 30 | Fill #1

## 2016-10-23 MED FILL — VALACYCLOVIR HCL 500 MG TAB: 500 | 30 days supply | Qty: 60 | Fill #4

## 2016-11-02 MED FILL — DEXTROAMP-AMPHET ER 30 MG C: 30 | 30 days supply | Qty: 30 | Fill #0

## 2016-11-23 MED FILL — FLUoxetine HCL 40 MG CAPS: 40 | 30 days supply | Qty: 30 | Fill #2

## 2016-11-23 MED FILL — OMEPRAZOLE DR 40 MG CAPSULE: 40 | 30 days supply | Qty: 30 | Fill #5

## 2016-11-23 MED FILL — BUPROPION HCL XL 150 MG TAB: 150 | 30 days supply | Qty: 30 | Fill #5

## 2016-11-23 MED FILL — METOPROLOL SUCC ER 50 MG TA: 50 | 30 days supply | Qty: 30 | Fill #1

## 2016-11-23 MED FILL — VALACYCLOVIR HCL 500 MG TAB: 500 | 30 days supply | Qty: 60 | Fill #5

## 2016-11-28 MED FILL — MONTELUKAST SOD 10 MG TAB: 10 | 30 days supply | Qty: 30 | Fill #0

## 2016-11-28 MED FILL — LEVOCETIRIZINE 5 MG TABLET: 5 | 30 days supply | Qty: 30 | Fill #0

## 2016-11-29 MED FILL — VENTOLIN HFA 90 MCG INHALER: 108 (90 BAS | 25 days supply | Qty: 18 | Fill #0

## 2016-12-07 MED FILL — DEXTROAMP-AMPHET ER 30 MG C: 30 | 30 days supply | Qty: 30 | Fill #0

## 2016-12-21 MED FILL — OMEPRAZOLE DR 40 MG CAPSULE: 40 | 30 days supply | Qty: 30 | Fill #0

## 2016-12-21 MED FILL — VALACYCLOVIR HCL 500 MG TAB: 500 | 30 days supply | Qty: 60 | Fill #0

## 2016-12-21 MED FILL — FLUoxetine HCL 40 MG CAPS: 40 | 30 days supply | Qty: 30 | Fill #3

## 2016-12-21 MED FILL — buPROPion HCL ER (XL) 150 M: 150 | 30 days supply | Qty: 30 | Fill #0

## 2016-12-21 MED FILL — METOPROLOL SUCC ER 50 MG TA: 50 | 30 days supply | Qty: 30 | Fill #2

## 2017-01-03 MED FILL — metroNIDAZOLE 500 MG TABS: 500 | 10 days supply | Qty: 30 | Fill #0

## 2017-01-03 MED FILL — LEVOCETIRIZINE 5 MG TABLET: 5 | 30 days supply | Qty: 30 | Fill #1

## 2017-01-03 MED FILL — MONTELUKAST SOD 10 MG TAB: 10 | 30 days supply | Qty: 30 | Fill #1

## 2017-01-03 MED FILL — CIPROFLOXACIN HCL 500 MG TA: 500 | 10 days supply | Qty: 20 | Fill #0

## 2017-01-15 MED FILL — FLUoxetine HCL 40 MG CAPS: 40 | 30 days supply | Qty: 30 | Fill #0

## 2017-01-15 MED FILL — buPROPion HCL ER (XL) 150 M: 150 | 30 days supply | Qty: 30 | Fill #0

## 2017-01-15 MED FILL — OMEPRAZOLE DR 40 MG CAPSULE: 40 | 30 days supply | Qty: 30 | Fill #0

## 2017-01-15 MED FILL — METOPROLOL SUCC ER 50 MG TA: 50 | 30 days supply | Qty: 45 | Fill #0

## 2017-01-25 MED FILL — VALACYCLOVIR HCL 500 MG TAB: 500 | 30 days supply | Qty: 60 | Fill #1

## 2017-01-29 MED FILL — DEXTROAMP-AMPHET ER 30 MG C: 30 | 30 days supply | Qty: 30 | Fill #0

## 2017-01-29 MED FILL — DEXTROAMP-AMPHETAMINE 5 MG: 5 | 30 days supply | Qty: 30 | Fill #0

## 2017-01-31 MED FILL — LEVOCETIRIZINE 5 MG TABLET: 5 | 30 days supply | Qty: 30 | Fill #0

## 2017-01-31 MED FILL — MONTELUKAST SOD 10 MG TAB: 10 | 30 days supply | Qty: 30 | Fill #0

## 2017-02-21 MED FILL — FLUoxetine HCL 40 MG CAPS: 40 | 30 days supply | Qty: 30 | Fill #1

## 2017-02-21 MED FILL — OMEPRAZOLE DR 40 MG CAPSULE: 40 | 30 days supply | Qty: 30 | Fill #1

## 2017-02-21 MED FILL — METOPROLOL SUCC ER 50 MG TA: 50 | 30 days supply | Qty: 45 | Fill #1

## 2017-02-21 MED FILL — BUPROPION HCL XL 150 MG TAB: 150 | 30 days supply | Qty: 30 | Fill #1

## 2017-03-05 MED FILL — MONTELUKAST SOD 10 MG TAB: 10 | 30 days supply | Qty: 30 | Fill #1

## 2017-03-05 MED FILL — VALACYCLOVIR HCL 500 MG TAB: 500 | 30 days supply | Qty: 60 | Fill #2

## 2017-03-05 MED FILL — LEVOCETIRIZINE 5 MG TABLET: 5 | 30 days supply | Qty: 30 | Fill #1

## 2017-03-14 MED FILL — METOPROLOL SUCC ER 50 MG TA: 50 | 30 days supply | Qty: 45 | Fill #0

## 2017-03-14 MED FILL — DEXTROAMP-AMPHET ER 30 MG C: 30 | 30 days supply | Qty: 30 | Fill #0

## 2017-03-25 MED FILL — SUMATRIPTAN SUCC 100 MG TAB: 100 | 21 days supply | Qty: 5 | Fill #0

## 2017-03-26 MED FILL — PROMETHAZINE 25 MG TABLET: 25 | 10 days supply | Qty: 40 | Fill #0

## 2017-03-27 MED FILL — OMEPRAZOLE DR 40 MG CAPSULE: 40 | 30 days supply | Qty: 30 | Fill #2

## 2017-03-27 MED FILL — buPROPion HCL ER (XL) 150 M: 150 | 30 days supply | Qty: 30 | Fill #2

## 2017-03-27 MED FILL — FLUoxetine HCL 40 MG CAPS: 40 | 30 days supply | Qty: 30 | Fill #2

## 2017-04-03 MED FILL — VALACYCLOVIR HCL 500 MG TAB: 500 | 30 days supply | Qty: 60 | Fill #3

## 2017-04-03 MED FILL — MONTELUKAST SOD 10 MG TAB: 10 | 30 days supply | Qty: 30 | Fill #2

## 2017-04-03 MED FILL — LEVOCETIRIZINE 5 MG TABLET: 5 | 30 days supply | Qty: 30 | Fill #2

## 2017-04-18 MED FILL — DEXTROAMP-AMPHETAMINE 5 MG: 5 | 30 days supply | Qty: 30 | Fill #0

## 2017-04-18 MED FILL — DEXTROAMP-AMPHET ER 30 MG C: 30 | 30 days supply | Qty: 30 | Fill #0

## 2017-04-19 MED FILL — METOPROLOL SUCC ER 50 MG TA: 50 | 30 days supply | Qty: 45 | Fill #1

## 2017-04-19 MED FILL — BUPROPION HCL XL 150 MG TAB: 150 | 30 days supply | Qty: 30 | Fill #3

## 2017-04-25 MED FILL — FLUoxetine HCL 40 MG CAPS: 40 | 30 days supply | Qty: 30 | Fill #3

## 2017-04-25 MED FILL — OMEPRAZOLE DR 40 MG CAPSULE: 40 | 30 days supply | Qty: 30 | Fill #3

## 2017-04-30 MED FILL — MONTELUKAST SOD 10 MG TAB: 10 | 30 days supply | Qty: 30 | Fill #3

## 2017-04-30 MED FILL — LEVOCETIRIZINE 5 MG TABLET: 5 | 30 days supply | Qty: 30 | Fill #3

## 2017-04-30 MED FILL — VALACYCLOVIR HCL 500 MG TAB: 500 | 30 days supply | Qty: 60 | Fill #4

## 2017-05-21 MED FILL — METOPROLOL SUCC ER 50 MG TA: 50 | 30 days supply | Qty: 45 | Fill #2

## 2017-05-23 MED FILL — DEXTROAMP-AMPHETAMINE 5 MG: 5 | 30 days supply | Qty: 30 | Fill #0

## 2017-05-23 MED FILL — DEXTROAMP-AMPHET ER 30 MG C: 30 | 30 days supply | Qty: 30 | Fill #0

## 2017-05-24 MED FILL — BUPROPION XL 150 MG TAB: 150 | 30 days supply | Qty: 30 | Fill #1

## 2017-05-31 MED FILL — OMEPRAZOLE DR 40 MG CAPSULE: 40 | 30 days supply | Qty: 30 | Fill #1

## 2017-05-31 MED FILL — VALACYCLOVIR HCL 500 MG TAB: 500 | 30 days supply | Qty: 60 | Fill #5

## 2017-05-31 MED FILL — LEVOCETIRIZINE 5 MG TABLET: 5 | 30 days supply | Qty: 30 | Fill #4

## 2017-05-31 MED FILL — FLUoxetine HCL 40 MG CAPS: 40 | 30 days supply | Qty: 30 | Fill #4

## 2017-05-31 MED FILL — MONTELUKAST SOD 10 MG TAB: 10 | 30 days supply | Qty: 30 | Fill #4

## 2017-06-20 MED FILL — DEXTROAMP-AMPHET ER 30 MG C: 30 | 30 days supply | Qty: 30 | Fill #0

## 2017-06-21 MED FILL — METOPROLOL SUCC ER 50 MG TA: 50 | 30 days supply | Qty: 45 | Fill #0

## 2017-06-21 MED FILL — BUPROPION HCL XL 150 MG TAB: 150 | 30 days supply | Qty: 30 | Fill #0

## 2017-07-03 MED FILL — FLUoxetine HCL 40 MG CAPS: 40 | 30 days supply | Qty: 30 | Fill #5

## 2017-07-03 MED FILL — VALACYCLOVIR HCL 500 MG TAB: 500 | 30 days supply | Qty: 30 | Fill #0

## 2017-07-03 MED FILL — MONTELUKAST SOD 10 MG TAB: 10 | 30 days supply | Qty: 30 | Fill #5

## 2017-07-03 MED FILL — OMEPRAZOLE DR 40 MG CAPSULE: 40 | 30 days supply | Qty: 30 | Fill #2

## 2017-07-03 MED FILL — LEVOCETIRIZINE 5 MG TABLET: 5 | 30 days supply | Qty: 30 | Fill #5

## 2017-07-09 NOTE — Consult Note (Signed)
NAME:  Brenda Little, Brenda Little                   ACCOUNT NO.:  [id number]  MEDICAL RECORD NO.:  [id number]  LOCATION:                                 FACILITY:  PHYSICIAN:  Ralene Bathe. Matthew Saras, M.D.    DATE OF BIRTH:  DATE OF CONSULTATION: DATE OF DISCHARGE:                                CONSULTATION   CHIEF COMPLAINT:  Menorrhagia, pelvic pain, failed ablation.  HISTORY OF PRESENT ILLNESS:  A 46 year old, G2, P0.  She is single.  In 2016, this patient underwent endometrial ablation for menorrhagia.  This really failed to correct her bleeding problem, which has continued, now presents for definitive hysterectomy to treat pelvic pain and menorrhagia.  Last ultrasound around the time of her ablation in October, 2016 showed several small fibroids 2.4 and 2.0 in the intramural position.  The procedure of TAH, bilateral salpingectomy, discussed with her in detail including specific risks related to bleeding, infection, transfusion, adjacent organ injury, wound infection, phlebitis, the possibility of requiring BSO discussed.  If significant adhesive disease is noted, the rationale for bilateral salpingectomy to reduce later life risk of ovarian cancer explained also.  PAST MEDICAL HISTORY:  History is significant for a partial sigmoid colectomy through a midline incision for diverticulitis.  She also had a hernia repair with mesh in her upper abdomen for ventral hernia and cholecystectomy through an upper abdominal laparotomy with a small laparotomy for an appendectomy previously.  ALLERGIES: 1. LATEX. 2. CODEINE.  CURRENT MEDICATIONS:  Toprol, lorazepam p.r.n., Nexium, Wellbutrin 150 daily, Adderall 30 mg daily, Valtrex p.r.n.  PAST SURGICAL HISTORY:  Appendectomy, sigmoid colectomy for diverticular disease, upper abdominal hernia repair, cholecystectomy.  FAMILY HISTORY:  Significant for thyroid disease.  SOCIAL HISTORY:  Denies drug or tobacco use.  She does drink alcohol socially.   She is single.  PHYSICAL EXAMINATION:  VITAL SIGNS:  Blood pressure 122/80.  Her weight is 234. HEENT:  Unremarkable. NECK:  Supple.  No masses. LUNGS:  Clear. CARDIOVASCULAR:  Regular rate and rhythm without murmurs, rubs, gallops. BREASTS:  Without masses. ABDOMEN:  Soft, flat, nontender.  Surgical scars are noted as noted in her surgical history. PELVIC:  Vulva, vagina, cervix were normal.  Uterus was upper limit, normal in size, mobile.  Adnexa unremarkable. EXTREMITIES:  Unremarkable. NEUROLOGIC:  Unremarkable.  IMPRESSION:  Continued menorrhagia with a history of leiomyoma, failed ablation, pelvic pain, abdominal surgical history is noted.  PLAN:  TAH, bilateral salpingectomy, possible BSO.  Procedure and risks discussed as above.     Dawnn Nam M. Matthew Saras, M.D.   ______________________________ Ralene Bathe. Matthew Saras, M.D.    RMH/MEDQ  D:  07/09/2017  T:  07/09/2017  Job:  [id number]

## 2017-07-19 MED FILL — METOPROLOL SUCC ER 50 MG TA: 50 | 30 days supply | Qty: 45 | Fill #1

## 2017-07-19 MED FILL — BUPROPION HCL XL 150 MG TAB: 150 | 30 days supply | Qty: 30 | Fill #1

## 2017-07-24 NOTE — Patient Instructions (Addendum)
Your procedure is scheduled on:  Thursday, Sept. 6, 2018  Enter through the [employer] of [facility] at:  6:00 AM  Pick up the phone at the desk and dial [phone].  Call this number if you have problems the morning of surgery: [phone].  Remember: Do NOT eat food or drink after:  Midnight Wednesday  Take these medicines the morning of surgery with a SIP OF WATER:  Wellbutrin, Metoprolol  Bring Asthma Inhaler day of surgery  Stop ALL herbal medications at this time  Do NOT smoke the day of surgery.  Do NOT wear jewelry (body piercing), metal hair clips/bobby pins, make-up, artifical eyelashes or nail polish. Do NOT wear lotions, powders, or perfumes.  You may wear deodorant. Do NOT shave for 48 hours prior to surgery. Do NOT bring valuables to the hospital. Contacts, dentures, or bridgework may not be worn into surgery.  Leave suitcase in car.  After surgery it may be brought to your room.  For patients admitted to the hospital, checkout time is 11:00 AM the day of discharge.  Bring a copy of your healthcare power of attorney and living will documents.

## 2017-07-25 ENCOUNTER — Encounter (HOSPITAL_COMMUNITY)
Admission: RE | Admit: 2017-07-25 | Discharge: 2017-07-25 | Disposition: A | Discharge status: Home / Self Care | Payer: Managed Care, Other (non HMO) | Source: Ambulatory Visit | Attending: Obstetrics and Gynecology | Admitting: Obstetrics and Gynecology

## 2017-07-25 ENCOUNTER — Encounter (HOSPITAL_COMMUNITY): Payer: Self-pay

## 2017-07-25 DIAGNOSIS — Z01810 Encounter for preprocedural cardiovascular examination: Secondary | ICD-10-CM | POA: Diagnosis not present

## 2017-07-25 DIAGNOSIS — Z01812 Encounter for preprocedural laboratory examination: Secondary | ICD-10-CM | POA: Insufficient documentation

## 2017-07-25 LAB — BASIC METABOLIC PANEL
ANION GAP: 8 (ref 5–15)
BUN: 14 mg/dL (ref 6–20)
CALCIUM: 8.8 mg/dL — AB (ref 8.9–10.3)
CO2: 24 mmol/L (ref 22–32)
Chloride: 105 mmol/L (ref 101–111)
Creatinine, Ser: 1.04 mg/dL — ABNORMAL HIGH (ref 0.44–1.00)
GFR calc Af Amer: >60 mL/min (ref >60)
GLUCOSE: 87 mg/dL (ref 65–99)
Potassium: 5.1 mmol/L (ref 3.5–5.1)
Sodium: 137 mmol/L (ref 135–145)

## 2017-07-25 LAB — CBC
HCT: 34 % — ABNORMAL LOW (ref 36.0–46.0)
HEMOGLOBIN: 10.7 g/dL — AB (ref 12.0–15.0)
MCH: 26 pg (ref 26.0–34.0)
MCHC: 31.5 g/dL (ref 30.0–36.0)
MCV: 82.7 fL (ref 78.0–100.0)
PLATELETS: 355 10*3/uL (ref 150–400)
RBC: 4.11 MIL/uL (ref 3.87–5.11)
RDW: 15.7 % — ABNORMAL HIGH (ref 11.5–15.5)
WBC: 8.9 10*3/uL (ref 4.0–10.5)

## 2017-07-25 LAB — TYPE AND SCREEN
ABO/RH(D): O POS
Antibody Screen: NEGATIVE

## 2017-07-25 MED FILL — AZITHROMYCIN 250 MG TABLET: 250 | 5 days supply | Qty: 6 | Fill #0

## 2017-08-01 ENCOUNTER — Inpatient Hospital Stay (HOSPITAL_COMMUNITY): Payer: Managed Care, Other (non HMO) | Admitting: Anesthesiology

## 2017-08-01 ENCOUNTER — Inpatient Hospital Stay (HOSPITAL_COMMUNITY)
Admission: RE | Admit: 2017-08-01 | Discharge: 2017-08-03 | DRG: 742 | Disposition: A | Discharge status: Home / Self Care | Payer: Managed Care, Other (non HMO) | Source: Ambulatory Visit | Attending: Obstetrics and Gynecology | Admitting: Obstetrics and Gynecology

## 2017-08-01 ENCOUNTER — Encounter (HOSPITAL_COMMUNITY)
Admission: RE | Disposition: A | Discharge status: Home / Self Care | Payer: Self-pay | Source: Ambulatory Visit | Attending: Obstetrics and Gynecology

## 2017-08-01 ENCOUNTER — Encounter (HOSPITAL_COMMUNITY): Payer: Self-pay | Admitting: *Deleted

## 2017-08-01 DIAGNOSIS — D649 Anemia, unspecified: Secondary | ICD-10-CM | POA: Diagnosis present

## 2017-08-01 DIAGNOSIS — N920 Excessive and frequent menstruation with regular cycle: Secondary | ICD-10-CM | POA: Diagnosis present

## 2017-08-01 DIAGNOSIS — Z91040 Latex allergy status: Secondary | ICD-10-CM

## 2017-08-01 DIAGNOSIS — R102 Pelvic and perineal pain: Secondary | ICD-10-CM | POA: Diagnosis present

## 2017-08-01 DIAGNOSIS — D259 Leiomyoma of uterus, unspecified: Principal | ICD-10-CM | POA: Diagnosis present

## 2017-08-01 DIAGNOSIS — Z6841 Body Mass Index (BMI) 40.0 and over, adult: Secondary | ICD-10-CM | POA: Diagnosis not present

## 2017-08-01 DIAGNOSIS — J45909 Unspecified asthma, uncomplicated: Secondary | ICD-10-CM | POA: Diagnosis present

## 2017-08-01 DIAGNOSIS — K219 Gastro-esophageal reflux disease without esophagitis: Secondary | ICD-10-CM | POA: Diagnosis present

## 2017-08-01 LAB — PREGNANCY, URINE: Preg Test, Ur: NEGATIVE

## 2017-08-01 SURGERY — HYSTERECTOMY, ABDOMINAL, WITH SALPINGO-OOPHORECTOMY
Anesthesia: General | Site: Abdomen | Laterality: Bilateral

## 2017-08-01 MED ORDER — FENTANYL CITRATE (PF) 250 MCG/5ML IJ SOLN
INTRAMUSCULAR | Status: AC
  Filled 2017-08-01: qty 5

## 2017-08-01 MED ORDER — SUGAMMADEX SODIUM 200 MG/2ML IV SOLN
INTRAVENOUS | Status: DC | PRN
  Administered 2017-08-01: 200 mg via INTRAVENOUS

## 2017-08-01 MED ORDER — METOPROLOL SUCCINATE ER 50 MG PO TB24
50.0000 mg | ORAL_TABLET | Freq: Every day | ORAL | Status: DC
  Administered 2017-08-02 – 2017-08-03 (×2): 50 mg via ORAL
  Filled 2017-08-01 (×2): qty 1

## 2017-08-01 MED ORDER — AMPHETAMINE-DEXTROAMPHETAMINE 10 MG PO TABS: 5.0000 mg | ORAL_TABLET | Freq: Every day | ORAL | Status: DC | PRN

## 2017-08-01 MED ORDER — KETOROLAC TROMETHAMINE 30 MG/ML IJ SOLN
INTRAMUSCULAR | Status: AC
  Filled 2017-08-01: qty 1

## 2017-08-01 MED ORDER — BUPROPION HCL ER (XL) 150 MG PO TB24
150.0000 mg | ORAL_TABLET | Freq: Every day | ORAL | Status: DC
  Administered 2017-08-02 – 2017-08-03 (×2): 150 mg via ORAL
  Filled 2017-08-01 (×2): qty 1

## 2017-08-01 MED ORDER — ONDANSETRON HCL 4 MG/2ML IJ SOLN
INTRAMUSCULAR | Status: AC
  Filled 2017-08-01: qty 2

## 2017-08-01 MED ORDER — OXYCODONE-ACETAMINOPHEN 5-325 MG PO TABS
1.0000 | ORAL_TABLET | ORAL | Status: DC | PRN
  Administered 2017-08-02 (×2): 2 via ORAL
  Filled 2017-08-01 (×2): qty 2

## 2017-08-01 MED ORDER — BUTORPHANOL TARTRATE 1 MG/ML IJ SOLN: 1.0000 mg | INTRAMUSCULAR | Status: DC | PRN

## 2017-08-01 MED ORDER — LACTATED RINGERS IV SOLN
INTRAVENOUS | Status: DC
  Administered 2017-08-01 (×2): via INTRAVENOUS

## 2017-08-01 MED ORDER — SUGAMMADEX SODIUM 200 MG/2ML IV SOLN
INTRAVENOUS | Status: AC
  Filled 2017-08-01: qty 2

## 2017-08-01 MED ORDER — PROMETHAZINE HCL 25 MG RE SUPP: 25.0000 mg | Freq: Once | RECTAL | Status: DC | PRN

## 2017-08-01 MED ORDER — MIDAZOLAM HCL 2 MG/2ML IJ SOLN
INTRAMUSCULAR | Status: AC
  Filled 2017-08-01: qty 2

## 2017-08-01 MED ORDER — LORATADINE 10 MG PO TABS
10.0000 mg | ORAL_TABLET | Freq: Every evening | ORAL | Status: DC
  Administered 2017-08-01 – 2017-08-02 (×2): 10 mg via ORAL
  Filled 2017-08-01 (×2): qty 1

## 2017-08-01 MED ORDER — DIPHENHYDRAMINE HCL 50 MG/ML IJ SOLN: 12.5000 mg | Freq: Four times a day (QID) | INTRAMUSCULAR | Status: DC | PRN

## 2017-08-01 MED ORDER — CEFOTETAN DISODIUM-DEXTROSE 2-2.08 GM-% IV SOLR
2.0000 g | INTRAVENOUS | Status: AC
  Administered 2017-08-01: 2 g via INTRAVENOUS

## 2017-08-01 MED ORDER — PHENYLEPHRINE 40 MCG/ML (10ML) SYRINGE FOR IV PUSH (FOR BLOOD PRESSURE SUPPORT)
PREFILLED_SYRINGE | INTRAVENOUS | Status: AC
  Filled 2017-08-01: qty 10

## 2017-08-01 MED ORDER — FENTANYL CITRATE (PF) 100 MCG/2ML IJ SOLN
25.0000 ug | INTRAMUSCULAR | Status: DC | PRN
  Administered 2017-08-01: 50 ug via INTRAVENOUS

## 2017-08-01 MED ORDER — PROPOFOL 10 MG/ML IV BOLUS
INTRAVENOUS | Status: DC | PRN
  Administered 2017-08-01: 180 mg via INTRAVENOUS

## 2017-08-01 MED ORDER — ONDANSETRON HCL 4 MG PO TABS: 4.0000 mg | ORAL_TABLET | Freq: Four times a day (QID) | ORAL | Status: DC | PRN

## 2017-08-01 MED ORDER — ALBUTEROL SULFATE (2.5 MG/3ML) 0.083% IN NEBU: 3.0000 mL | INHALATION_SOLUTION | Freq: Four times a day (QID) | RESPIRATORY_TRACT | Status: DC | PRN

## 2017-08-01 MED ORDER — CEFOTETAN DISODIUM-DEXTROSE 2-2.08 GM-% IV SOLR
INTRAVENOUS | Status: AC
  Filled 2017-08-01: qty 50

## 2017-08-01 MED ORDER — PANTOPRAZOLE SODIUM 40 MG PO TBEC
40.0000 mg | DELAYED_RELEASE_TABLET | Freq: Every day | ORAL | Status: DC
  Administered 2017-08-02: 40 mg via ORAL
  Filled 2017-08-01: qty 1

## 2017-08-01 MED ORDER — FENTANYL CITRATE (PF) 100 MCG/2ML IJ SOLN
INTRAMUSCULAR | Status: DC | PRN
  Administered 2017-08-01 (×2): 100 ug via INTRAVENOUS
  Administered 2017-08-01: 50 ug via INTRAVENOUS

## 2017-08-01 MED ORDER — BUPIVACAINE LIPOSOME 1.3 % IJ SUSP
20.0000 mL | Freq: Once | INTRAMUSCULAR | Status: AC
  Administered 2017-08-01: 20 mL
  Filled 2017-08-01: qty 20

## 2017-08-01 MED ORDER — PHENYLEPHRINE HCL 10 MG/ML IJ SOLN
INTRAMUSCULAR | Status: DC | PRN
  Administered 2017-08-01 (×2): 80 ug via INTRAVENOUS

## 2017-08-01 MED ORDER — LEVOCETIRIZINE DIHYDROCHLORIDE 5 MG PO TABS: 5.0000 mg | ORAL_TABLET | Freq: Every evening | ORAL | Status: DC

## 2017-08-01 MED ORDER — ROCURONIUM BROMIDE 100 MG/10ML IV SOLN
INTRAVENOUS | Status: AC
  Filled 2017-08-01: qty 1

## 2017-08-01 MED ORDER — DEXAMETHASONE SODIUM PHOSPHATE 10 MG/ML IJ SOLN
INTRAMUSCULAR | Status: DC | PRN
  Administered 2017-08-01: 10 mg via INTRAVENOUS

## 2017-08-01 MED ORDER — ONDANSETRON HCL 4 MG/2ML IJ SOLN
4.0000 mg | Freq: Four times a day (QID) | INTRAMUSCULAR | Status: DC | PRN
  Filled 2017-08-01: qty 2

## 2017-08-01 MED ORDER — SCOPOLAMINE 1 MG/3DAYS TD PT72
MEDICATED_PATCH | TRANSDERMAL | Status: AC
  Administered 2017-08-01: 1.5 mg via TRANSDERMAL
  Filled 2017-08-01: qty 1

## 2017-08-01 MED ORDER — SODIUM CHLORIDE 0.9 % IJ SOLN
INTRAMUSCULAR | Status: DC | PRN
  Administered 2017-08-01: 30 mL

## 2017-08-01 MED ORDER — PNEUMOCOCCAL VAC POLYVALENT 25 MCG/0.5ML IJ INJ
0.5000 mL | INJECTION | INTRAMUSCULAR | Status: DC
  Filled 2017-08-01: qty 0.5

## 2017-08-01 MED ORDER — SODIUM CHLORIDE 0.9% FLUSH: 9.0000 mL | INTRAVENOUS | Status: DC | PRN

## 2017-08-01 MED ORDER — FLUOXETINE HCL 20 MG PO CAPS
40.0000 mg | ORAL_CAPSULE | Freq: Every evening | ORAL | Status: DC
  Administered 2017-08-01 – 2017-08-02 (×2): 40 mg via ORAL
  Filled 2017-08-01 (×2): qty 2

## 2017-08-01 MED ORDER — SCOPOLAMINE 1 MG/3DAYS TD PT72
1.0000 | MEDICATED_PATCH | Freq: Once | TRANSDERMAL | Status: DC
  Administered 2017-08-01: 1.5 mg via TRANSDERMAL

## 2017-08-01 MED ORDER — BUPIVACAINE HCL 0.25 % IJ SOLN
INTRAMUSCULAR | Status: DC | PRN
  Administered 2017-08-01: 30 mL

## 2017-08-01 MED ORDER — KETOROLAC TROMETHAMINE 30 MG/ML IJ SOLN
30.0000 mg | Freq: Four times a day (QID) | INTRAMUSCULAR | Status: DC
  Administered 2017-08-01 – 2017-08-02 (×5): 30 mg via INTRAVENOUS
  Filled 2017-08-01 (×6): qty 1

## 2017-08-01 MED ORDER — MONTELUKAST SODIUM 10 MG PO TABS
10.0000 mg | ORAL_TABLET | Freq: Every day | ORAL | Status: DC
  Administered 2017-08-01 – 2017-08-02 (×2): 10 mg via ORAL
  Filled 2017-08-01 (×2): qty 1

## 2017-08-01 MED ORDER — DEXAMETHASONE SODIUM PHOSPHATE 10 MG/ML IJ SOLN
INTRAMUSCULAR | Status: AC
Start: 2017-08-01 — End: ?
  Filled 2017-08-01: qty 1

## 2017-08-01 MED ORDER — KETOROLAC TROMETHAMINE 30 MG/ML IJ SOLN: 30.0000 mg | Freq: Four times a day (QID) | INTRAMUSCULAR | Status: DC

## 2017-08-01 MED ORDER — NALOXONE HCL 0.4 MG/ML IJ SOLN: 0.4000 mg | INTRAMUSCULAR | Status: DC | PRN

## 2017-08-01 MED ORDER — IBUPROFEN 800 MG PO TABS
800.0000 mg | ORAL_TABLET | Freq: Three times a day (TID) | ORAL | Status: DC | PRN
  Administered 2017-08-02 – 2017-08-03 (×2): 800 mg via ORAL
  Filled 2017-08-01 (×2): qty 1

## 2017-08-01 MED ORDER — ONDANSETRON HCL 4 MG/2ML IJ SOLN
INTRAMUSCULAR | Status: DC | PRN
  Administered 2017-08-01: 4 mg via INTRAVENOUS

## 2017-08-01 MED ORDER — VALACYCLOVIR HCL 500 MG PO TABS
500.0000 mg | ORAL_TABLET | Freq: Every evening | ORAL | Status: DC
  Administered 2017-08-01 – 2017-08-02 (×2): 500 mg via ORAL
  Filled 2017-08-01 (×2): qty 1

## 2017-08-01 MED ORDER — 0.9 % SODIUM CHLORIDE (POUR BTL) OPTIME
TOPICAL | Status: DC | PRN
  Administered 2017-08-01: 2000 mL

## 2017-08-01 MED ORDER — DIPHENHYDRAMINE HCL 12.5 MG/5ML PO ELIX
12.5000 mg | ORAL_SOLUTION | Freq: Four times a day (QID) | ORAL | Status: DC | PRN
  Administered 2017-08-02: 12.5 mg via ORAL
  Filled 2017-08-01: qty 5

## 2017-08-01 MED ORDER — AMPHETAMINE-DEXTROAMPHET ER 30 MG PO CP24: 30.0000 mg | ORAL_CAPSULE | Freq: Every day | ORAL | Status: DC

## 2017-08-01 MED ORDER — DEXTROSE IN LACTATED RINGERS 5 % IV SOLN
INTRAVENOUS | Status: DC
  Administered 2017-08-01: 14:00:00 via INTRAVENOUS
  Administered 2017-08-01 – 2017-08-02 (×2): 125 mL/h via INTRAVENOUS

## 2017-08-01 MED ORDER — ROCURONIUM BROMIDE 100 MG/10ML IV SOLN
INTRAVENOUS | Status: DC | PRN
  Administered 2017-08-01: 5 mg via INTRAVENOUS
  Administered 2017-08-01: 10 mg via INTRAVENOUS
  Administered 2017-08-01: 40 mg via INTRAVENOUS

## 2017-08-01 MED ORDER — FENTANYL CITRATE (PF) 100 MCG/2ML IJ SOLN
INTRAMUSCULAR | Status: AC
  Filled 2017-08-01: qty 2

## 2017-08-01 MED ORDER — SODIUM CHLORIDE 0.9 % IJ SOLN
INTRAMUSCULAR | Status: AC
  Filled 2017-08-01: qty 10

## 2017-08-01 MED ORDER — MEPERIDINE HCL 25 MG/ML IJ SOLN: 6.2500 mg | INTRAMUSCULAR | Status: DC | PRN

## 2017-08-01 MED ORDER — LIDOCAINE HCL (CARDIAC) 20 MG/ML IV SOLN
INTRAVENOUS | Status: DC | PRN
  Administered 2017-08-01: 80 mg via INTRAVENOUS

## 2017-08-01 MED ORDER — LIDOCAINE HCL (CARDIAC) 20 MG/ML IV SOLN
INTRAVENOUS | Status: AC
  Filled 2017-08-01: qty 5

## 2017-08-01 MED ORDER — FENTANYL 40 MCG/ML IV SOLN
INTRAVENOUS | Status: DC
  Administered 2017-08-01: 30 ug via INTRAVENOUS
  Administered 2017-08-01: 40 ug via INTRAVENOUS
  Administered 2017-08-01: 11:00:00 via INTRAVENOUS
  Administered 2017-08-01: 40 ug via INTRAVENOUS
  Administered 2017-08-02: 30 ug via INTRAVENOUS
  Administered 2017-08-02 (×2): 10 ug via INTRAVENOUS
  Filled 2017-08-01: qty 1000
  Filled 2017-08-01: qty 25

## 2017-08-01 MED ORDER — ONDANSETRON HCL 4 MG/2ML IJ SOLN
4.0000 mg | Freq: Four times a day (QID) | INTRAMUSCULAR | Status: DC | PRN
  Administered 2017-08-01: 4 mg via INTRAVENOUS

## 2017-08-01 MED ORDER — PROPOFOL 10 MG/ML IV BOLUS
INTRAVENOUS | Status: AC
  Filled 2017-08-01: qty 20

## 2017-08-01 MED ORDER — KETOROLAC TROMETHAMINE 30 MG/ML IJ SOLN
30.0000 mg | Freq: Once | INTRAMUSCULAR | Status: AC
  Administered 2017-08-01: 30 mg via INTRAVENOUS

## 2017-08-01 MED ORDER — MENTHOL 3 MG MT LOZG: 1.0000 | LOZENGE | OROMUCOSAL | Status: DC | PRN

## 2017-08-01 MED ORDER — MIDAZOLAM HCL 2 MG/2ML IJ SOLN
INTRAMUSCULAR | Status: DC | PRN
  Administered 2017-08-01 (×2): 1 mg via INTRAVENOUS

## 2017-08-01 MED ORDER — KETOROLAC TROMETHAMINE 30 MG/ML IJ SOLN: 30.0000 mg | Freq: Once | INTRAMUSCULAR | Status: DC | PRN

## 2017-08-01 MED ORDER — BUPIVACAINE HCL (PF) 0.5 % IJ SOLN
INTRAMUSCULAR | Status: AC
Start: 2017-08-01 — End: ?
  Filled 2017-08-01: qty 30

## 2017-08-01 MED ORDER — SODIUM CHLORIDE 0.9 % IJ SOLN
INTRAMUSCULAR | Status: AC
  Filled 2017-08-01: qty 20

## 2017-08-01 SURGICAL SUPPLY — 47 items
APL SKNCLS STERI-STRIP NONHPOA (GAUZE/BANDAGES/DRESSINGS)
BARRIER ADHS 3X4 INTERCEED (GAUZE/BANDAGES/DRESSINGS) IMPLANT
BENZOIN TINCTURE PRP APPL 2/3 (GAUZE/BANDAGES/DRESSINGS) IMPLANT
BRR ADH 4X3 ABS CNTRL BYND (GAUZE/BANDAGES/DRESSINGS)
CANISTER SUCT 3000ML PPV (MISCELLANEOUS) ×3 IMPLANT
CLOSURE WOUND 1/2 X4 (GAUZE/BANDAGES/DRESSINGS)
CLOTH BEACON ORANGE TIMEOUT ST (SAFETY) ×3 IMPLANT
CONT PATH 16OZ SNAP LID 3702 (MISCELLANEOUS) ×3 IMPLANT
DECANTER SPIKE VIAL GLASS SM (MISCELLANEOUS) IMPLANT
DRAPE CESAREAN BIRTH W POUCH (DRAPES) ×3 IMPLANT
DRAPE WARM FLUID 44X44 (DRAPE) ×3 IMPLANT
DRSG OPSITE POSTOP 4X10 (GAUZE/BANDAGES/DRESSINGS) ×3 IMPLANT
DURAPREP 26ML APPLICATOR (WOUND CARE) ×3 IMPLANT
GAUZE SPONGE 4X4 16PLY XRAY LF (GAUZE/BANDAGES/DRESSINGS) IMPLANT
GLOVE BIO SURGEON STRL SZ7 (GLOVE) ×3 IMPLANT
GLOVE BIOGEL PI IND STRL 7.0 (GLOVE) ×2 IMPLANT
GLOVE BIOGEL PI INDICATOR 7.0 (GLOVE) ×4
GOWN STRL REUS W/TWL LRG LVL3 (GOWN DISPOSABLE) ×9 IMPLANT
HEMOSTAT ARISTA ABSORB 3G PWDR (MISCELLANEOUS) ×2 IMPLANT
NDL HYPO 18GX1.5 BLUNT FILL (NEEDLE) IMPLANT
NDL SPNL 22GX3.5 QUINCKE BK (NEEDLE) ×1 IMPLANT
NEEDLE HYPO 18GX1.5 BLUNT FILL (NEEDLE) IMPLANT
NEEDLE SPNL 22GX3.5 QUINCKE BK (NEEDLE) ×3 IMPLANT
NS IRRIG 1000ML POUR BTL (IV SOLUTION) ×3 IMPLANT
PACK ABDOMINAL GYN (CUSTOM PROCEDURE TRAY) ×3 IMPLANT
PAD OB MATERNITY 4.3X12.25 (PERSONAL CARE ITEMS) ×3 IMPLANT
PROTECTOR NERVE ULNAR (MISCELLANEOUS) ×3 IMPLANT
RTRCTR C-SECT PINK 25CM LRG (MISCELLANEOUS) ×3 IMPLANT
SPONGE LAP 18X18 X RAY DECT (DISPOSABLE) ×6 IMPLANT
STRIP CLOSURE SKIN 1/2X4 (GAUZE/BANDAGES/DRESSINGS) IMPLANT
SUT CHROMIC 3 0 SH 27 (SUTURE) IMPLANT
SUT MON AB 2-0 CT1 36 (SUTURE) IMPLANT
SUT MON AB 4-0 PS1 27 (SUTURE) ×3 IMPLANT
SUT PDS AB 0 CT1 27 (SUTURE) ×6 IMPLANT
SUT PDS AB 0 CTX 60 (SUTURE) ×2 IMPLANT
SUT VIC AB 0 CT1 18XCR BRD8 (SUTURE) ×2 IMPLANT
SUT VIC AB 0 CT1 27 (SUTURE) ×3
SUT VIC AB 0 CT1 27XBRD ANBCTR (SUTURE) ×1 IMPLANT
SUT VIC AB 0 CT1 8-18 (SUTURE) ×6
SUT VIC AB 2-0 CT1 27 (SUTURE)
SUT VIC AB 2-0 CT1 TAPERPNT 27 (SUTURE) IMPLANT
SUT VIC AB 3-0 CT1 27 (SUTURE) ×6
SUT VIC AB 3-0 CT1 TAPERPNT 27 (SUTURE) ×1 IMPLANT
SUT VICRYL 0 TIES 12 18 (SUTURE) ×3 IMPLANT
SYR 20CC LL (SYRINGE) ×3 IMPLANT
TOWEL OR 17X24 6PK STRL BLUE (TOWEL DISPOSABLE) ×6 IMPLANT
TRAY FOLEY CATH SILVER 14FR (SET/KITS/TRAYS/PACK) ×3 IMPLANT

## 2017-08-01 NOTE — Transfer of Care (Signed)
Immediate Anesthesia Transfer of Care Note  Patient: Brenda Little  Procedure(s) Performed: Procedure(s) with comments: HYSTERECTOMY ABDOMINAL WITH BILATERAL SALPINGO-OOPHORECTOMY (Bilateral) - Bilateral  Patient Location: PACU  Anesthesia Type:General  Level of Consciousness: sedated  Airway & Oxygen Therapy: Patient Spontanous Breathing and Patient connected to nasal cannula oxygen  Post-op Assessment: Report given to RN  Post vital signs: Reviewed and stable  Last Vitals:  Vitals:   08/01/17 0613  BP: (!) 145/95  Pulse: 69  Resp: 18  Temp: 36.9 C  SpO2: 98%    Last Pain:  Vitals:   08/01/17 0613  TempSrc: Oral      Patients Stated Pain Goal: 4 (55/[id number]/41 [id number])  Complications: No apparent anesthesia complications

## 2017-08-01 NOTE — Op Note (Signed)
Preoperative diagnosis: Pelvic pain, menorrhagia, previous failed ablation, leiomyoma  Postoperative diagnosis: Same  Procedure: TAH/BSO lysis of adhesions  EBL: 2 50 cc  Anesthesia: Gen.  Surgeon: Matthew Saras  Asst.: Gertie Fey   Procedure and findings: The patient taken the operating room after an adequate level of general anesthesia was obtained with the patient supine the abdomen prepped and draped in usual fashion and vagina and perineum was prepped separately and Foley catheter was was positioned. Appropriate timeouts taken at that point. After prepping and draping a transverse incision made carried down the fascia which was incised and extended transversely. Rectus muscle divided in the midline, peritoneum entered superiorly without incident and extended in a vertical fashion. The uterus was enlarged symmetrically 9-10 week size bilateral adnexa unremarkable cul-de-sac free and clear there was significant omental adhesions in the upper abdomen from her prior midline partial sigmoid colectomy for diverticular disease. This was not affecting the pelvis however.  She was placed in Trendelenburg Alexis retractor was positioned bowels packed superiorly out of the field. Long Kelly clamps were then placed across each utero-ovarian pedicle starting on the left the round ligament was clamped divided and suture ligated and the peritoneum carried to the midline same repeated on the opposite side with sharp and blunt dissection the bladder was dissected below the cervical vaginal junction. The left IP ligament was then isolated, the course of the ureter was noted to be well below the IP ligament was then clamped divided first free tie followed by suture ligature 0 Vicryl close to the ovary. This was repeated on the opposite side thus both ovaries and tubes were removed. Uterine basket or in either side was skeletonized clamped divided and suture ligated with 0 Vicryl suture. In sequential manner staying close to  the uterus the cardinal ligament uterosacral ligament and cervical vaginal pedicles were clamped divided and suture ligated with 0 Vicryl. The specimen was thus excised. Cuff closed with interrupted figure-of-eight sutures of 0 Vicryl the pelvis is in irrigated noted be hemostatic some minimal oozing at the cuff, prior to closure a wrist it was placed at the cuff for more complete hemostasis. Prior to closure sponge, needle, history counts reported as correct 2 peritoneum closed with 3-0 Vicryl running suture the same for the rectus muscles in the midline double looped 0 PDS was then used to close the fascia diluted X Pearl solution with half percent Marcaine and saline was then injected into the subfascial and subcutaneous areas for postoperative analgesia. Subcutaneous tissue was irrigated noted be hemostatic dead space closed and the subcutaneous with 3-0 Vicryl running suture 4-0 Monocryl subcuticular skin closure with a honeycomb dressing she tolerated this well went to recovery room in good condition. Clear urine noted at the end the case  Dictated with [facility]  Margarette Asal M.D.

## 2017-08-01 NOTE — Anesthesia Postprocedure Evaluation (Signed)
Anesthesia Post Note  Patient: Brenda Little  Procedure(s) Performed: Procedure(s) (LRB): HYSTERECTOMY ABDOMINAL WITH BILATERAL SALPINGO-OOPHORECTOMY (Bilateral)     Patient location during evaluation: PACU Anesthesia Type: General Level of consciousness: awake Pain management: pain level controlled Vital Signs Assessment: post-procedure vital signs reviewed and stable Respiratory status: spontaneous breathing Cardiovascular status: stable Postop Assessment: no signs of nausea or vomiting Anesthetic complications: no    Last Vitals:  Vitals:   08/01/17 0930 08/01/17 0945  BP: (!) 142/81   Pulse: 73 71  Resp: 14 12  Temp: 36.6 C   SpO2: 92% 100%    Last Pain:  Vitals:   08/01/17 0613  TempSrc: Oral   Pain Goal: Patients Stated Pain Goal: 4 (08/01/17 2951)               [name] JR,JOHN Mateo Flow

## 2017-08-01 NOTE — Anesthesia Procedure Notes (Signed)
Procedure Name: Intubation Date/Time: 08/01/2017 7:19 AM Performed by: Casimer Lanius A Pre-anesthesia Checklist: Patient identified, Emergency Drugs available, Suction available and Patient being monitored Patient Re-evaluated:Patient Re-evaluated prior to induction Oxygen Delivery Method: Circle system utilized and Simple face mask Preoxygenation: Pre-oxygenation with 100% oxygen Induction Type: IV induction Ventilation: Mask ventilation without difficulty Laryngoscope Size: Mac and 3 Grade View: Grade III Tube type: Oral Tube size: 7.0 mm Number of attempts: 1 Airway Equipment and Method: Stylet Placement Confirmation: ETT inserted through vocal cords under direct vision,  positive ETCO2 and breath sounds checked- equal and bilateral Secured at: 20 (right lip) cm Tube secured with: Tape Dental Injury: Teeth and Oropharynx as per pre-operative assessment

## 2017-08-01 NOTE — OR Nursing (Signed)
SPECIMEN TIMEOUT COMPLETED WITH SURGICAL STAFF AND SURGEON.

## 2017-08-01 NOTE — Anesthesia Preprocedure Evaluation (Signed)
Anesthesia Evaluation  Patient identified by MRN, date of birth, ID band Patient awake    Reviewed: Allergy & Precautions, NPO status , Patient's Chart, lab work & pertinent test results  Airway Mallampati: I       Dental no notable dental hx. (+) Teeth Intact   Pulmonary asthma ,    Pulmonary exam normal breath sounds clear to auscultation       Cardiovascular Normal cardiovascular exam Rhythm:Regular Rate:Normal     Neuro/Psych PSYCHIATRIC DISORDERS Depression    GI/Hepatic GERD  Medicated and Controlled,  Endo/Other  Morbid obesity  Renal/GU      Musculoskeletal   Abdominal (+) + obese,   Peds  Hematology  (+) Blood dyscrasia, anemia ,   Anesthesia Other Findings   Reproductive/Obstetrics                             Anesthesia Physical Anesthesia Plan  ASA: III  Anesthesia Plan: General   Post-op Pain Management:    Induction: Intravenous  PONV Risk Score and Plan: 4 or greater and Ondansetron, Dexamethasone, Midazolam and Scopolamine patch - Pre-op  Airway Management Planned: Oral ETT  Additional Equipment:   Intra-op Plan:   Post-operative Plan: Extubation in OR  Informed Consent: I have reviewed the patients History and Physical, chart, labs and discussed the procedure including the risks, benefits and alternatives for the proposed anesthesia with the patient or authorized representative who has indicated his/her understanding and acceptance.   Dental advisory given  Plan Discussed with: CRNA and Surgeon  Anesthesia Plan Comments:         Anesthesia Quick Evaluation

## 2017-08-01 NOTE — Progress Notes (Signed)
The patient was re-examined with no change in status 

## 2017-08-02 LAB — CBC
HCT: 27.7 % — ABNORMAL LOW (ref 36.0–46.0)
Hemoglobin: 8.9 g/dL — ABNORMAL LOW (ref 12.0–15.0)
MCH: 25.9 pg — ABNORMAL LOW (ref 26.0–34.0)
MCHC: 32.1 g/dL (ref 30.0–36.0)
MCV: 80.8 fL (ref 78.0–100.0)
PLATELETS: 299 10*3/uL (ref 150–400)
RBC: 3.43 MIL/uL — AB (ref 3.87–5.11)
RDW: 15.7 % — ABNORMAL HIGH (ref 11.5–15.5)
WBC: 14.6 10*3/uL — AB (ref 4.0–10.5)

## 2017-08-02 NOTE — Progress Notes (Signed)
1 Day Post-Op Procedure(s) (LRB): HYSTERECTOMY ABDOMINAL WITH BILATERAL SALPINGO-OOPHORECTOMY (Bilateral)  Subjective: Patient reports tolerating PO.    Objective: I have reviewed patient's vital signs, intake and output and labs.  abd soft + BS>>dressing dry CBC    Component Value Date/Time   WBC 14.6 (H) 08/02/2017 0550   RBC 3.43 (L) 08/02/2017 0550   HGB 8.9 (L) 08/02/2017 0550   HGB 10.1 (L) 04/13/2011 1048   HCT 27.7 (L) 08/02/2017 0550   HCT 33.2 (L) 04/13/2011 1048   PLT 299 08/02/2017 0550   PLT 347 04/13/2011 1048   MCV 80.8 08/02/2017 0550   MCV 72.5 (L) 04/13/2011 1048   MCH 25.9 (L) 08/02/2017 0550   MCHC 32.1 08/02/2017 0550   RDW 15.7 (H) 08/02/2017 0550   RDW 17.1 (H) 04/13/2011 1048   LYMPHSABS 0.6 (L) 11/19/2011 2034   LYMPHSABS 2.3 04/13/2011 1048   MONOABS 0.6 11/19/2011 2034   MONOABS 0.6 04/13/2011 1048   EOSABS 0.3 11/19/2011 2034   EOSABS 0.2 04/13/2011 1048   BASOSABS 0.0 11/19/2011 2034   BASOSABS 0.0 04/13/2011 1048    Assessment: s/p Procedure(s) with comments: HYSTERECTOMY ABDOMINAL WITH BILATERAL SALPINGO-OOPHORECTOMY (Bilateral) - Bilateral: stable  Plan: Advance diet Encourage ambulation Advance to PO medication  prob plan D/C for AM  LOS: 1 day    Eugene Zeiders M 08/02/2017, 8:30 AM

## 2017-08-03 MED ORDER — FERROUS SULFATE 325 (65 FE) MG PO TABS: 325.0000 mg | ORAL_TABLET | Freq: Every day | ORAL | 1 refills | Status: AC

## 2017-08-03 MED ORDER — HYDROMORPHONE HCL 2 MG PO TABS: 2.0000 mg | ORAL_TABLET | Freq: Four times a day (QID) | ORAL | 0 refills | Status: AC | PRN

## 2017-08-03 NOTE — Progress Notes (Signed)
Discharge instructions reviewed with patient.  Patient states understanding of home care, medications, activity, signs/symptoms to report to MD and return MD office visit.  Patients significant other and family will assist with her care @ home.  No home  equipment needed, patient has prescriptions and all personal belongings.  Patient discharged via wheelchair in stable condition with staff without incident.  

## 2017-08-03 NOTE — Discharge Summary (Signed)
Physician Discharge Summary  Patient ID: Brenda Little MRN: [id number] DOB/AGE: 48/08/1971 48 y.o.  Admit date: 08/01/2017 Discharge date: 08/03/2017  Admission [place], [place]  Discharge Diagnoses: SAME Active Problems:   Pelvic pain   Discharged Condition: good  Hospital Course: adm for TAHBSO, on POD 1, diet advanced, by POD 2 was afeb, tol PO and ready for D/C  Consults: None  Significant Diagnostic Studies: labs:  CBC    Component Value Date/Time   WBC 14.6 (H) 08/02/2017 0550   RBC 3.43 (L) 08/02/2017 0550   HGB 8.9 (L) 08/02/2017 0550   HGB 10.1 (L) 04/13/2011 1048   HCT 27.7 (L) 08/02/2017 0550   HCT 33.2 (L) 04/13/2011 1048   PLT 299 08/02/2017 0550   PLT 347 04/13/2011 1048   MCV 80.8 08/02/2017 0550   MCV 72.5 (L) 04/13/2011 1048   MCH 25.9 (L) 08/02/2017 0550   MCHC 32.1 08/02/2017 0550   RDW 15.7 (H) 08/02/2017 0550   RDW 17.1 (H) 04/13/2011 1048   LYMPHSABS 0.6 (L) 11/19/2011 2034   LYMPHSABS 2.3 04/13/2011 1048   MONOABS 0.6 11/19/2011 2034   MONOABS 0.6 04/13/2011 1048   EOSABS 0.3 11/19/2011 2034   EOSABS 0.2 04/13/2011 1048   BASOSABS 0.0 11/19/2011 2034   BASOSABS 0.0 04/13/2011 1048     Treatments: surgery: TAHBSO  Discharge Exam: Blood pressure (!) 104/51, pulse 72, temperature 98.5 F (36.9 C), temperature source Oral, resp. rate 18, height 5' 4.02" (1.626 m), weight 249 lb (112.9 kg), last menstrual period 07/09/2017, SpO2 98 %. abd soft + BS, bandage dry  Disposition: 01-Home or Self Care   Allergies as of 08/03/2017      Reactions   Latex    blisters   Percocet [oxycodone-acetaminophen] Itching   Turns red   Codeine Hives, Itching, Rash      Medication List    TAKE these medications   albuterol 108 (90 Base) MCG/ACT inhaler Commonly known as:  PROVENTIL HFA;VENTOLIN HFA Inhale 2 puffs into the lungs every 6 (six) hours as needed for wheezing or shortness of breath. For wheezing.    amphetamine-dextroamphetamine 30 MG 24 hr capsule Commonly known as:  ADDERALL XR Take 30 mg by mouth daily.   amphetamine-dextroamphetamine 5 MG tablet Commonly known as:  ADDERALL Take 5 mg by mouth daily as needed (tiredness/inability to focus (in the evening)).   B-complex with vitamin C tablet Take 1 tablet by mouth daily.   buPROPion 150 MG 24 hr tablet Commonly known as:  WELLBUTRIN XL TAKE 1 TABLET (150 MG TOTAL) BY MOUTH DAILY. What changed:  how much to take  how to take this  when to take this  additional instructions   cholecalciferol 1000 units tablet Commonly known as:  VITAMIN D Take 2,000 Units by mouth daily with lunch.   ferrous sulfate 325 (65 FE) MG tablet Commonly known as:  FERROUSUL Take 1 tablet (325 mg total) by mouth daily with breakfast.   FLUoxetine 40 MG capsule Commonly known as:  PROZAC TAKE 1 CAPSULE (40 MG TOTAL) BY MOUTH DAILY. What changed:  how much to take  how to take this  when to take this  additional instructions   HYDROmorphone 2 MG tablet Commonly known as:  DILAUDID Take 1 tablet (2 mg total) by mouth every 6 (six) hours as needed for severe pain.   ibuprofen 200 MG tablet Commonly known as:  ADVIL,MOTRIN Take 800 mg by mouth every 8 (eight) hours as needed (for pain.).  levocetirizine 5 MG tablet Commonly known as:  XYZAL Take 5 mg by mouth every evening.   MELATONIN SL Place 1 tablet under the tongue at bedtime.   metoprolol succinate 50 MG 24 hr tablet Commonly known as:  TOPROL-XL Take 1 tablet (50 mg total) by mouth daily. What changed:  how much to take   montelukast 10 MG tablet Commonly known as:  SINGULAIR Take 10 mg by mouth at bedtime.   multivitamin with minerals Tabs tablet Take 1 tablet by mouth daily.   omeprazole 40 MG capsule Commonly known as:  PRILOSEC TAKE 1 CAPSULE BY MOUTH DAILY. What changed:  how much to take  how to take this  when to take this  additional  instructions   valACYclovir 500 MG tablet Commonly known as:  VALTREX Take 500 mg by mouth every evening.            Discharge Care Instructions        Start     Ordered   08/03/17 0000  HYDROmorphone (DILAUDID) 2 MG tablet  Every 6 hours PRN     08/03/17 0821   08/03/17 0000  ferrous sulfate (FERROUSUL) 325 (65 FE) MG tablet  Daily with breakfast     08/03/17 [place] Schedule an appointment as soon as possible for a visit in 1 week(s).   Contact information: [address] [address] [phone]           Signed: Margarette Asal 08/03/2017, 8:21 AM

## 2017-08-05 MED FILL — FLUoxetine HCL 40 MG CAPS: 40 | 30 days supply | Qty: 30 | Fill #0

## 2017-08-05 MED FILL — OMEPRAZOLE DR 40 MG CAPSULE: 40 | 30 days supply | Qty: 30 | Fill #3

## 2017-08-05 MED FILL — LEVOCETIRIZINE 5 MG TABLET: 5 | 30 days supply | Qty: 30 | Fill #0

## 2017-08-05 MED FILL — MONTELUKAST SOD 10 MG TAB: 10 | 30 days supply | Qty: 30 | Fill #0

## 2017-08-05 MED FILL — VALACYCLOVIR HCL 500 MG TAB: 500 | 30 days supply | Qty: 30 | Fill #1

## 2017-08-15 MED FILL — DEXTROAMP-AMPHETAMINE 5 MG: 5 | 30 days supply | Qty: 30 | Fill #0

## 2017-08-15 MED FILL — DEXTROAMP-AMPHET ER 30 MG C: 30 | 30 days supply | Qty: 30 | Fill #0

## 2017-08-20 MED FILL — METOPROLOL SUCC ER 50 MG TA: 50 | 30 days supply | Qty: 45 | Fill #2

## 2017-08-20 MED FILL — buPROPion HCL ER (XL) 150 M: 150 | 30 days supply | Qty: 30 | Fill #2

## 2017-09-04 MED FILL — LEVOCETIRIZINE 5 MG TABLET: 5 | 30 days supply | Qty: 30 | Fill #1

## 2017-09-04 MED FILL — OMEPRAZOLE DR 40 MG CAPSULE: 40 | 30 days supply | Qty: 30 | Fill #4

## 2017-09-04 MED FILL — VALACYCLOVIR HCL 500 MG TAB: 500 | 30 days supply | Qty: 30 | Fill #2

## 2017-09-04 MED FILL — MONTELUKAST SOD 10 MG TAB: 10 | 30 days supply | Qty: 30 | Fill #1

## 2017-09-04 MED FILL — FLUoxetine HCL 40 MG CAPS: 40 | 30 days supply | Qty: 30 | Fill #1

## 2017-09-13 MED FILL — VENTOLIN HFA 90 MCG INHALER: 108 (90 BAS | 25 days supply | Qty: 18 | Fill #1

## 2017-09-18 MED FILL — METOPROLOL SUCC ER 50 MG TA: 50 | 30 days supply | Qty: 45 | Fill #3

## 2017-09-18 MED FILL — buPROPion HCL ER (XL) 150 M: 150 | 30 days supply | Qty: 30 | Fill #3

## 2017-09-26 MED FILL — DEXTROAMP-AMPHET ER 30 MG C: 30 | 30 days supply | Qty: 30 | Fill #0

## 2017-10-04 MED FILL — FLUoxetine HCL 40 MG CAPS: 40 | 30 days supply | Qty: 30 | Fill #2

## 2017-10-04 MED FILL — LEVOCETIRIZINE 5 MG TABLET: 5 | 30 days supply | Qty: 30 | Fill #2

## 2017-10-04 MED FILL — MONTELUKAST SOD 10 MG TAB: 10 | 30 days supply | Qty: 30 | Fill #2

## 2017-10-04 MED FILL — VALACYCLOVIR HCL 500 MG TAB: 500 | 30 days supply | Qty: 30 | Fill #3

## 2017-10-04 MED FILL — OMEPRAZOLE DR 40 MG CAPSULE: 40 | 30 days supply | Qty: 30 | Fill #5

## 2017-10-16 MED FILL — buPROPion HCL ER (XL) 150 M: 150 | 30 days supply | Qty: 30 | Fill #4

## 2017-10-16 MED FILL — METOPROLOL SUCC ER 50 MG TA: 50 | 30 days supply | Qty: 45 | Fill #4

## 2017-10-25 MED FILL — AMPHETAMINE SALTS 5 MG TAB: 5 | 30 days supply | Qty: 30 | Fill #0

## 2017-10-25 MED FILL — DEXTROAMP-AMPHET ER 30 MG C: 30 | 30 days supply | Qty: 30 | Fill #0

## 2017-11-01 MED FILL — OMEPRAZOLE DR 40 MG CAPSULE: 40 | 30 days supply | Qty: 30 | Fill #0

## 2017-11-01 MED FILL — FLUoxetine HCL 40 MG CAPS: 40 | 30 days supply | Qty: 30 | Fill #3

## 2017-11-01 MED FILL — LEVOCETIRIZINE 5 MG TABLET: 5 | 30 days supply | Qty: 30 | Fill #3

## 2017-11-01 MED FILL — MONTELUKAST SOD 10 MG TAB: 10 | 30 days supply | Qty: 30 | Fill #3

## 2017-11-01 MED FILL — VALACYCLOVIR HCL 500 MG TAB: 500 | 30 days supply | Qty: 30 | Fill #4

## 2017-11-14 MED FILL — buPROPion HCL ER (XL) 150 M: 150 | 30 days supply | Qty: 30 | Fill #5

## 2017-11-14 MED FILL — METOPROLOL SUCC ER 50 MG TA: 50 | 30 days supply | Qty: 45 | Fill #5

## 2017-11-22 MED FILL — VENTOLIN HFA 90 MCG INHALER: 108 (90 BAS | 25 days supply | Qty: 18 | Fill #2

## 2017-12-02 MED FILL — MONTELUKAST SOD 10 MG TAB: 10 | 30 days supply | Qty: 30 | Fill #4

## 2017-12-02 MED FILL — OMEPRAZOLE DR 40 MG CAPSULE: 40 | 30 days supply | Qty: 30 | Fill #1

## 2017-12-02 MED FILL — VALACYCLOVIR HCL 500 MG TAB: 500 | 30 days supply | Qty: 30 | Fill #5

## 2017-12-02 MED FILL — FLUoxetine HCL 40 MG CAPS: 40 | 30 days supply | Qty: 30 | Fill #4

## 2017-12-02 MED FILL — LEVOCETIRIZINE 5 MG TABLET: 5 | 30 days supply | Qty: 30 | Fill #4

## 2017-12-03 MED FILL — AMPHETAMINE SALTS 5 MG TAB: 5 | 30 days supply | Qty: 30 | Fill #0

## 2017-12-03 MED FILL — DEXTROAMP-AMPHET ER 30 MG C: 30 | 30 days supply | Qty: 30 | Fill #0

## 2017-12-17 MED FILL — BUPROPION HCL XL 150 MG TAB: 150 | 30 days supply | Qty: 30 | Fill #6

## 2017-12-17 MED FILL — METOPROLOL SUCC ER 50 MG TA: 50 | 30 days supply | Qty: 45 | Fill #6

## 2018-01-03 MED FILL — FLUoxetine HCL 40 MG CAPS: 40 | 30 days supply | Qty: 30 | Fill #5

## 2018-01-03 MED FILL — MONTELUKAST SOD 10 MG TAB: 10 | 30 days supply | Qty: 30 | Fill #5

## 2018-01-03 MED FILL — VALACYCLOVIR HCL 500 MG TAB: 500 | 30 days supply | Qty: 30 | Fill #6

## 2018-01-03 MED FILL — LEVOCETIRIZINE 5 MG TABLET: 5 | 30 days supply | Qty: 30 | Fill #5

## 2018-01-03 MED FILL — OMEPRAZOLE DR 40 MG CAPSULE: 40 | 30 days supply | Qty: 30 | Fill #2

## 2018-01-09 MED FILL — DEXTROAMP-AMPHET ER 30 MG C: 30 | 30 days supply | Qty: 30 | Fill #0

## 2018-01-09 MED FILL — DEXTROAMP-AMPHETAMINE 5 MG: 5 | 30 days supply | Qty: 30 | Fill #0

## 2018-01-15 MED FILL — buPROPion HCL ER (XL) 150 M: 150 | 30 days supply | Qty: 30 | Fill #7

## 2018-01-15 MED FILL — METOPROLOL SUCCINATE ER 50: 50 | 30 days supply | Qty: 45 | Fill #7

## 2018-01-31 MED FILL — MONTELUKAST SOD 10 MG TAB: 10 | 30 days supply | Qty: 30 | Fill #6

## 2018-01-31 MED FILL — FLUoxetine HCL 40 MG CAPS: 40 | 30 days supply | Qty: 30 | Fill #6

## 2018-01-31 MED FILL — VALACYCLOVIR HCL 500 MG TAB: 500 | 30 days supply | Qty: 30 | Fill #7

## 2018-01-31 MED FILL — OMEPRAZOLE DR 40 MG CAPSULE: 40 | 30 days supply | Qty: 30 | Fill #3

## 2018-01-31 MED FILL — LEVOCETIRIZINE 5 MG TABLET: 5 | 30 days supply | Qty: 30 | Fill #6

## 2018-02-14 MED FILL — VENTOLIN HFA 90 MCG INHALER: 108 (90 BAS | 30 days supply | Qty: 18 | Fill #0

## 2018-02-14 MED FILL — DEXTROAMP-AMPHET ER 30 MG C: 30 | 30 days supply | Qty: 30 | Fill #0

## 2018-02-14 MED FILL — METOPROLOL SUCCINATE ER 50: 50 | 30 days supply | Qty: 45 | Fill #8

## 2018-03-05 MED FILL — FLUoxetine HCL 40 MG CAPS: 40 | 30 days supply | Qty: 30 | Fill #7

## 2018-03-05 MED FILL — MONTELUKAST SOD 10 MG TAB: 10 | 30 days supply | Qty: 30 | Fill #7

## 2018-03-05 MED FILL — VALACYCLOVIR HCL 500 MG TAB: 500 | 30 days supply | Qty: 30 | Fill #8

## 2018-03-05 MED FILL — LEVOCETIRIZINE 5 MG TABLET: 5 | 30 days supply | Qty: 30 | Fill #7

## 2018-03-05 MED FILL — OMEPRAZOLE DR 40 MG CAPSULE: 40 | 30 days supply | Qty: 30 | Fill #4

## 2018-03-06 MED FILL — buPROPion HCL ER (XL) 150 M: 150 | 30 days supply | Qty: 30 | Fill #8

## 2018-03-17 MED FILL — METOPROLOL SUCCINATE ER 50: 50 | 30 days supply | Qty: 45 | Fill #9

## 2018-03-25 MED FILL — DEXTROAMP-AMPHET ER 30 MG C: 30 | 30 days supply | Qty: 30 | Fill #0

## 2018-04-01 MED FILL — DOXYCYCLINE HYCLATE 100 MG: 100 | 10 days supply | Qty: 20 | Fill #0

## 2018-04-03 MED FILL — MONTELUKAST SOD 10 MG TAB: 10 | 30 days supply | Qty: 30 | Fill #8

## 2018-04-03 MED FILL — buPROPion HCL ER (XL) 150 M: 150 | 30 days supply | Qty: 30 | Fill #9

## 2018-04-03 MED FILL — LEVOCETIRIZINE 5 MG TABLET: 5 | 30 days supply | Qty: 30 | Fill #8

## 2018-04-03 MED FILL — FLUoxetine HCL 40 MG CAPS: 40 | 30 days supply | Qty: 30 | Fill #8

## 2018-04-03 MED FILL — VALACYCLOVIR HCL 500 MG TAB: 500 | 30 days supply | Qty: 30 | Fill #9

## 2018-04-03 MED FILL — OMEPRAZOLE 40 MG CPDR: 40 | 30 days supply | Qty: 30 | Fill #5

## 2018-04-14 MED FILL — METOPROLOL SUCCINATE ER 50: 50 | 30 days supply | Qty: 45 | Fill #10

## 2018-05-06 MED FILL — DEXTROAMP-AMPHET ER 30 MG C: 30 | 30 days supply | Qty: 30 | Fill #0

## 2018-05-06 MED FILL — LEVOCETIRIZINE 5 MG TABLET: 5 | 30 days supply | Qty: 30 | Fill #9

## 2018-05-06 MED FILL — VALACYCLOVIR HCL 500 MG TAB: 500 | 30 days supply | Qty: 30 | Fill #10

## 2018-05-06 MED FILL — MONTELUKAST SOD 10 MG TAB: 10 | 30 days supply | Qty: 30 | Fill #9

## 2018-05-06 MED FILL — OMEPRAZOLE 40 MG CPDR: 40 | 30 days supply | Qty: 30 | Fill #6

## 2018-05-06 MED FILL — buPROPion HCL ER (XL) 150 M: 150 | 30 days supply | Qty: 30 | Fill #10

## 2018-05-06 MED FILL — FLUoxetine HCL 40 MG CAPS: 40 | 30 days supply | Qty: 30 | Fill #9

## 2018-05-06 MED FILL — DEXTROAMP-AMPHETAMINE 5 MG: 5 | 30 days supply | Qty: 30 | Fill #0

## 2018-05-13 MED FILL — METOPROLOL SUCCINATE ER 50: 50 | 30 days supply | Qty: 45 | Fill #11

## 2018-06-04 MED FILL — OMEPRAZOLE 40 MG CPDR: 40 | 30 days supply | Qty: 30 | Fill #7

## 2018-06-04 MED FILL — buPROPion HCL ER (XL) 150 M: 150 | 30 days supply | Qty: 30 | Fill #11

## 2018-06-04 MED FILL — FLUoxetine HCL 40 MG CAPS: 40 | 30 days supply | Qty: 30 | Fill #10

## 2018-06-04 MED FILL — MONTELUKAST SOD 10 MG TAB: 10 | 30 days supply | Qty: 30 | Fill #10

## 2018-06-04 MED FILL — LEVOCETIRIZINE 5 MG TABLET: 5 | 30 days supply | Qty: 30 | Fill #10

## 2018-06-04 MED FILL — VALACYCLOVIR HCL 500 MG TAB: 500 | 30 days supply | Qty: 30 | Fill #11

## 2018-06-10 MED FILL — METOPROLOL SUCCINATE ER 50: 50 | 30 days supply | Qty: 45 | Fill #0

## 2018-06-13 MED FILL — DEXTROAMP-AMPHETAMINE 5 MG: 5 | 30 days supply | Qty: 30 | Fill #0

## 2018-06-13 MED FILL — DEXTROAMP-AMPHET ER 30 MG C: 30 | 30 days supply | Qty: 30 | Fill #0

## 2018-07-03 MED FILL — OMEPRAZOLE 40 MG CPDR: 40 | 30 days supply | Qty: 30 | Fill #0

## 2018-07-03 MED FILL — MONTELUKAST SOD 10 MG TAB: 10 | 30 days supply | Qty: 30 | Fill #0

## 2018-07-03 MED FILL — VALACYCLOVIR HCL 500 MG TAB: 500 | 30 days supply | Qty: 30 | Fill #0

## 2018-07-03 MED FILL — LEVOCETIRIZINE 5 MG TABLET: 5 | 30 days supply | Qty: 30 | Fill #0

## 2018-07-03 MED FILL — FLUoxetine HCL 40 MG CAPS: 40 | 30 days supply | Qty: 30 | Fill #0

## 2018-07-09 MED FILL — METOPROLOL SUCCINATE ER 50: 50 | 30 days supply | Qty: 45 | Fill #1

## 2018-07-10 MED FILL — buPROPion HCL ER (XL) 150 M: 150 | 30 days supply | Qty: 30 | Fill #0

## 2018-07-14 MED FILL — DEXTROAMP-AMPHET ER 30 MG C: 30 | 30 days supply | Qty: 30 | Fill #0

## 2018-08-05 MED FILL — OMEPRAZOLE 40 MG CPDR: 40 | 30 days supply | Qty: 30 | Fill #1

## 2018-08-05 MED FILL — VALACYCLOVIR HCL 500 MG TAB: 500 | 30 days supply | Qty: 30 | Fill #1

## 2018-08-05 MED FILL — METOPROLOL SUCCINATE ER 50: 50 | 30 days supply | Qty: 45 | Fill #2

## 2018-08-05 MED FILL — FLUoxetine HCL 40 MG CAPS: 40 | 30 days supply | Qty: 30 | Fill #1

## 2018-08-05 MED FILL — MONTELUKAST SOD 10 MG TAB: 10 | 30 days supply | Qty: 30 | Fill #1

## 2018-08-05 MED FILL — LEVOCETIRIZINE 5 MG TABLET: 5 | 30 days supply | Qty: 30 | Fill #1

## 2018-08-05 MED FILL — buPROPion HCL ER (XL) 150 M: 150 | 30 days supply | Qty: 30 | Fill #1

## 2018-09-04 MED FILL — MONTELUKAST SOD 10 MG TAB: 10 | 30 days supply | Qty: 30 | Fill #2

## 2018-09-04 MED FILL — VALACYCLOVIR HCL 500 MG TAB: 500 | 30 days supply | Qty: 30 | Fill #2

## 2018-09-04 MED FILL — LEVOCETIRIZINE 5 MG TABLET: 5 | 30 days supply | Qty: 30 | Fill #2

## 2018-09-04 MED FILL — FLUoxetine HCL 40 MG CAPS: 40 | 30 days supply | Qty: 30 | Fill #2

## 2018-09-04 MED FILL — OMEPRAZOLE 40 MG CPDR: 40 | 30 days supply | Qty: 30 | Fill #2

## 2018-09-05 MED FILL — METOPROLOL SUCCINATE ER 50: 50 | 30 days supply | Qty: 45 | Fill #0

## 2018-09-05 MED FILL — VENTOLIN HFA 90 MCG INHALER: 108 (90 BAS | 17 days supply | Qty: 18 | Fill #0

## 2018-09-05 MED FILL — buPROPion HCL ER (XL) 150 M: 150 | 30 days supply | Qty: 30 | Fill #2

## 2018-09-25 MED FILL — DEXTROAMP-AMPHET ER 30 MG C: 30 | 30 days supply | Qty: 30 | Fill #0

## 2018-10-01 MED FILL — METOPROLOL SUCCINATE ER 50: 50 | 30 days supply | Qty: 45 | Fill #1

## 2018-10-06 MED FILL — FLUoxetine HCL 40 MG CAPS: 40 | 30 days supply | Qty: 30 | Fill #0

## 2018-10-06 MED FILL — OMEPRAZOLE 40 MG CPDR: 40 | 30 days supply | Qty: 30 | Fill #0

## 2018-10-06 MED FILL — LEVOCETIRIZINE 5 MG TABLET: 5 | 30 days supply | Qty: 30 | Fill #0

## 2018-10-06 MED FILL — VALACYCLOVIR HCL 500 MG TAB: 500 | 30 days supply | Qty: 30 | Fill #0

## 2018-10-06 MED FILL — MONTELUKAST SOD 10 MG TAB: 10 | 30 days supply | Qty: 30 | Fill #0

## 2018-10-30 MED FILL — METOPROLOL SUCCINATE ER 50: 50 | 30 days supply | Qty: 45 | Fill #2

## 2018-11-05 MED FILL — VALACYCLOVIR HCL 500 MG TAB: 500 | 30 days supply | Qty: 30 | Fill #1

## 2018-11-05 MED FILL — OMEPRAZOLE 40 MG CPDR: 40 | 30 days supply | Qty: 30 | Fill #1

## 2018-11-05 MED FILL — buPROPion HCL ER (XL) 150 M: 150 | 30 days supply | Qty: 30 | Fill #0

## 2018-11-05 MED FILL — LEVOCETIRIZINE 5 MG TABLET: 5 | 30 days supply | Qty: 30 | Fill #1

## 2018-11-05 MED FILL — FLUoxetine HCL 40 MG CAPS: 40 | 30 days supply | Qty: 30 | Fill #1

## 2018-11-05 MED FILL — MONTELUKAST SOD 10 MG TAB: 10 | 30 days supply | Qty: 30 | Fill #1

## 2018-11-05 MED FILL — DEXTROAMP-AMPHET ER 30 MG C: 30 | 30 days supply | Qty: 30 | Fill #0

## 2018-11-27 MED FILL — METOPROLOL SUCCINATE ER 50: 50 | 30 days supply | Qty: 45 | Fill #0

## 2018-12-02 MED FILL — MONTELUKAST SOD 10 MG TAB: 10 | 30 days supply | Qty: 30 | Fill #2

## 2018-12-02 MED FILL — LEVOCETIRIZINE 5 MG TABLET: 5 | 30 days supply | Qty: 30 | Fill #2

## 2018-12-02 MED FILL — FLUoxetine HCL 40 MG CAPS: 40 | 30 days supply | Qty: 30 | Fill #2

## 2018-12-02 MED FILL — buPROPion HCL ER (XL) 150 M: 150 | 30 days supply | Qty: 30 | Fill #1

## 2018-12-02 MED FILL — VALACYCLOVIR HCL 500 MG TAB: 500 | 30 days supply | Qty: 30 | Fill #2

## 2018-12-02 MED FILL — OMEPRAZOLE 40 MG CPDR: 40 | 30 days supply | Qty: 30 | Fill #2

## 2018-12-04 MED FILL — DEXTROAMP-AMPHET ER 30 MG C: 30 | 30 days supply | Qty: 30 | Fill #0

## 2018-12-05 MED FILL — DEXTROAMP-AMPHETAMINE 5 MG: 5 | 30 days supply | Qty: 30 | Fill #0

## 2018-12-29 MED FILL — METOPROLOL SUCCINATE ER 50: 50 | 30 days supply | Qty: 45 | Fill #1

## 2019-01-05 MED FILL — FLUoxetine HCL 40 MG CAPS: 40 | 30 days supply | Qty: 30 | Fill #0

## 2019-01-05 MED FILL — buPROPion HCL ER (XL) 150 M: 150 | 30 days supply | Qty: 30 | Fill #2

## 2019-01-05 MED FILL — MONTELUKAST SOD 10 MG TAB: 10 | 30 days supply | Qty: 30 | Fill #0

## 2019-01-05 MED FILL — VALACYCLOVIR HCL 500 MG TAB: 500 | 30 days supply | Qty: 30 | Fill #0

## 2019-01-05 MED FILL — LEVOCETIRIZINE 5 MG TABLET: 5 | 30 days supply | Qty: 30 | Fill #0

## 2019-01-05 MED FILL — OMEPRAZOLE 40 MG CPDR: 40 | 30 days supply | Qty: 30 | Fill #0

## 2019-01-06 MED FILL — AMPHETAMINE-DEXTROAMPHET ER: 30 | 30 days supply | Qty: 30 | Fill #0

## 2019-01-26 MED FILL — METOPROLOL SUCCINATE ER 50: 50 | 30 days supply | Qty: 45 | Fill #2

## 2019-02-04 MED FILL — OMEPRAZOLE 40 MG CPDR: 40 | 30 days supply | Qty: 30 | Fill #1

## 2019-02-04 MED FILL — VALACYCLOVIR HCL 500 MG TAB: 500 | 30 days supply | Qty: 30 | Fill #1

## 2019-02-04 MED FILL — FLUoxetine HCL 40 MG CAPS: 40 | 30 days supply | Qty: 30 | Fill #1

## 2019-02-04 MED FILL — buPROPion HCL ER (XL) 150 M: 150 | 30 days supply | Qty: 30 | Fill #0

## 2019-02-04 MED FILL — LEVOCETIRIZINE 5 MG TABLET: 5 | 30 days supply | Qty: 30 | Fill #1

## 2019-02-04 MED FILL — MONTELUKAST SOD 10 MG TAB: 10 | 30 days supply | Qty: 30 | Fill #1

## 2019-02-05 MED FILL — AMPHETAMINE-DEXTROAMPHET ER: 30 | 30 days supply | Qty: 30 | Fill #0

## 2019-02-05 MED FILL — DEXTROAMP-AMPHETAMINE 5 MG: 5 | 30 days supply | Qty: 30 | Fill #0

## 2019-02-26 MED FILL — buPROPion HCL ER (XL) 150 M: 150 | 30 days supply | Qty: 30 | Fill #0

## 2019-02-26 MED FILL — VALACYCLOVIR HCL 500 MG TAB: 500 | 30 days supply | Qty: 30 | Fill #0

## 2019-02-26 MED FILL — OMEPRAZOLE 40 MG CPDR: 40 | 30 days supply | Qty: 30 | Fill #0

## 2019-02-26 MED FILL — LEVOCETIRIZINE 5 MG TABLET: 5 | 30 days supply | Qty: 30 | Fill #0

## 2019-02-26 MED FILL — METOPROLOL SUCCINATE ER 50: 50 | 30 days supply | Qty: 45 | Fill #0

## 2019-02-26 MED FILL — FLUoxetine HCL 40 MG CAPS: 40 | 30 days supply | Qty: 30 | Fill #0

## 2019-02-26 MED FILL — MONTELUKAST SOD 10 MG TAB: 10 | 30 days supply | Qty: 30 | Fill #0

## 2019-03-12 MED FILL — AMPHETAMINE-DEXTROAMPHET ER: 30 | 30 days supply | Qty: 30 | Fill #0

## 2019-03-25 MED FILL — METOPROLOL SUCCINATE ER 50: 50 | 30 days supply | Qty: 45 | Fill #1

## 2019-04-06 MED FILL — OMEPRAZOLE 40 MG CPDR: 40 | 30 days supply | Qty: 30 | Fill #1

## 2019-04-06 MED FILL — MONTELUKAST SOD 10 MG TAB: 10 | 30 days supply | Qty: 30 | Fill #1

## 2019-04-06 MED FILL — FLUoxetine HCL 40 MG CAPS: 40 | 30 days supply | Qty: 30 | Fill #1

## 2019-04-06 MED FILL — VALACYCLOVIR HCL 500 MG TAB: 500 | 30 days supply | Qty: 30 | Fill #1

## 2019-04-06 MED FILL — buPROPion HCL ER (XL) 150 M: 150 | 30 days supply | Qty: 30 | Fill #1

## 2019-04-06 MED FILL — LEVOCETIRIZINE 5 MG TABLET: 5 | 30 days supply | Qty: 30 | Fill #1

## 2019-04-14 MED FILL — AMPHETAMINE-DEXTROAMPHET ER: 30 | 30 days supply | Qty: 30 | Fill #0

## 2019-04-21 MED FILL — METOPROLOL SUCCINATE ER 50: 50 | 30 days supply | Qty: 45 | Fill #2

## 2019-04-30 MED FILL — ALBUTEROL SULFATE HFA 108 (: 108 (90 BAS | 17 days supply | Qty: 9 | Fill #0

## 2019-05-01 MED FILL — LEVOCETIRIZINE 5 MG TABLET: 5 | 30 days supply | Qty: 30 | Fill #2

## 2019-05-01 MED FILL — OMEPRAZOLE 40 MG CPDR: 40 | 30 days supply | Qty: 30 | Fill #2

## 2019-05-01 MED FILL — FLUoxetine HCL 40 MG CAPS: 40 | 30 days supply | Qty: 30 | Fill #2

## 2019-05-01 MED FILL — MONTELUKAST SOD 10 MG TAB: 10 | 30 days supply | Qty: 30 | Fill #2

## 2019-05-01 MED FILL — VALACYCLOVIR HCL 500 MG TAB: 500 | 30 days supply | Qty: 30 | Fill #2

## 2019-05-14 MED FILL — AMPHETAMINE-DEXTROAMPHET ER: 30 | 30 days supply | Qty: 30 | Fill #0

## 2019-05-15 MED FILL — buPROPion HCL ER (XL) 150 M: 150 | 30 days supply | Qty: 30 | Fill #2

## 2019-05-19 MED FILL — METOPROLOL SUCCINATE ER 50: 50 | 30 days supply | Qty: 45 | Fill #3

## 2019-06-04 MED FILL — VALACYCLOVIR HCL 500 MG TAB: 500 | 30 days supply | Qty: 30 | Fill #2

## 2019-06-04 MED FILL — MONTELUKAST SOD 10 MG TAB: 10 | 30 days supply | Qty: 30 | Fill #2

## 2019-06-04 MED FILL — FLUoxetine HCL 40 MG CAPS: 40 | 30 days supply | Qty: 30 | Fill #2

## 2019-06-04 MED FILL — OMEPRAZOLE 40 MG CPDR: 40 | 30 days supply | Qty: 30 | Fill #2

## 2019-06-04 MED FILL — LEVOCETIRIZINE 5 MG TABLET: 5 | 30 days supply | Qty: 30 | Fill #2

## 2019-06-16 MED FILL — buPROPion HCL ER (XL) 150 M: 150 | 30 days supply | Qty: 30 | Fill #1

## 2019-06-16 MED FILL — METOPROLOL SUCCINATE ER 50: 50 | 30 days supply | Qty: 45 | Fill #4

## 2019-06-17 MED FILL — AMPHETAMINE-DEXTROAMPHET ER: 30 | 30 days supply | Qty: 30 | Fill #0

## 2019-07-07 MED FILL — LEVOCETIRIZINE 5 MG TABLET: 5 | 30 days supply | Qty: 30 | Fill #0

## 2019-07-07 MED FILL — OMEPRAZOLE 40 MG CPDR: 40 | 30 days supply | Qty: 30 | Fill #0

## 2019-07-07 MED FILL — MONTELUKAST SOD 10 MG TAB: 10 | 30 days supply | Qty: 30 | Fill #0

## 2019-07-07 MED FILL — VALACYCLOVIR HCL 500 MG TAB: 500 | 30 days supply | Qty: 30 | Fill #0

## 2019-07-07 MED FILL — FLUoxetine HCL 40 MG CAPS: 40 | 30 days supply | Qty: 30 | Fill #0

## 2019-07-15 MED FILL — buPROPion HCL ER (XL) 150 M: 150 | 30 days supply | Qty: 30 | Fill #2

## 2019-07-15 MED FILL — METOPROLOL SUCCINATE ER 50: 50 | 30 days supply | Qty: 45 | Fill #5

## 2019-07-21 MED FILL — AMPHETAMINE-DEXTROAMPHET ER: 30 | 30 days supply | Qty: 30 | Fill #0

## 2019-08-05 MED FILL — FLUoxetine HCL 40 MG CAPS: 40 | 30 days supply | Qty: 30 | Fill #1

## 2019-08-05 MED FILL — MONTELUKAST SOD 10 MG TAB: 10 | 30 days supply | Qty: 30 | Fill #1

## 2019-08-05 MED FILL — VALACYCLOVIR HCL 500 MG TAB: 500 | 30 days supply | Qty: 30 | Fill #1

## 2019-08-05 MED FILL — OMEPRAZOLE 40 MG CPDR: 40 | 30 days supply | Qty: 30 | Fill #1

## 2019-08-05 MED FILL — LEVOCETIRIZINE 5 MG TABLET: 5 | 30 days supply | Qty: 30 | Fill #1

## 2019-08-11 MED FILL — METOPROLOL SUCCINATE ER 50: 50 | 30 days supply | Qty: 45 | Fill #0

## 2019-08-11 MED FILL — buPROPion HCL ER (XL) 150 M: 150 | 30 days supply | Qty: 30 | Fill #3

## 2019-09-04 MED FILL — FLUoxetine HCL 40 MG CAPS: 40 | 30 days supply | Qty: 30 | Fill #2

## 2019-09-04 MED FILL — LEVOCETIRIZINE 5 MG TABLET: 5 | 30 days supply | Qty: 30 | Fill #2

## 2019-09-04 MED FILL — OMEPRAZOLE 40 MG CPDR: 40 | 30 days supply | Qty: 30 | Fill #2

## 2019-09-04 MED FILL — MONTELUKAST SOD 10 MG TAB: 10 | 30 days supply | Qty: 30 | Fill #2

## 2019-09-04 MED FILL — VALACYCLOVIR HCL 500 MG TAB: 500 | 30 days supply | Qty: 30 | Fill #2

## 2019-09-08 MED FILL — METOPROLOL SUCCINATE ER 50: 50 | 30 days supply | Qty: 45 | Fill #1

## 2019-09-09 ENCOUNTER — Other Ambulatory Visit: Payer: Self-pay

## 2019-09-09 ENCOUNTER — Ambulatory Visit (HOSPITAL_COMMUNITY)
Admission: EM | Admit: 2019-09-09 | Discharge: 2019-09-09 | Disposition: A | Discharge status: Home / Self Care | Payer: Managed Care, Other (non HMO) | Attending: Family Medicine | Admitting: Family Medicine

## 2019-09-09 ENCOUNTER — Encounter (HOSPITAL_COMMUNITY): Payer: Self-pay

## 2019-09-09 ENCOUNTER — Ambulatory Visit (INDEPENDENT_AMBULATORY_CARE_PROVIDER_SITE_OTHER): Payer: Managed Care, Other (non HMO)

## 2019-09-09 DIAGNOSIS — U071 COVID-19: Secondary | ICD-10-CM

## 2019-09-09 DIAGNOSIS — R030 Elevated blood-pressure reading, without diagnosis of hypertension: Secondary | ICD-10-CM

## 2019-09-09 DIAGNOSIS — R0602 Shortness of breath: Secondary | ICD-10-CM

## 2019-09-09 DIAGNOSIS — R059 Cough, unspecified: Secondary | ICD-10-CM

## 2019-09-09 DIAGNOSIS — R05 Cough: Secondary | ICD-10-CM | POA: Diagnosis not present

## 2019-09-09 NOTE — ED Triage Notes (Signed)
Pt states she tested positive for Covid the 1st of the month. Pt states she has a cough and SOB.

## 2019-09-09 NOTE — Discharge Instructions (Addendum)
Call your provider to inform them about the chest x-ray findings that appear to be a viral pneumonia.   Your blood pressure was noted to be elevated during your visit today. You may return here within the next few days to recheck if unable to see your primary care doctor. If your blood pressure remains persistently elevated, you may need to begin taking a medication.  BP (!) 180/98 (BP Location: Right Arm)    Pulse 78    Resp 18    Wt 117.9 kg    LMP 07/09/2017 (Exact Date)    SpO2 100%    BMI 44.61 kg/m

## 2019-09-10 NOTE — ED Provider Notes (Signed)
[place]   [id number] 09/09/19 Arrival Time: 5993  ASSESSMENT & PLAN:  1. COVID-19   2. Cough   3. SOB (shortness of breath)   4. Elevated blood pressure reading without diagnosis of hypertension      Suspected bilateral viral pneumonia on CXR. She reports overall feeling much better than when her symptoms started. No active respiratory difficulties or feeling of being SOB. Sp02 100% here. Has been afebrile. Discussed no indication for the need for hospital admission at this time. Tolerating normal PO intake. She is comfortable with home observation. Plans to call her doctor to discuss follow up and to recheck BP. Agrees to proceed to the ED should any of her symptoms worsen.  See AVS for discharge instructions.  Declines Rx cough medication. Prefers OTC symptom care. Ensure adequate fluid intake and rest. May f/u with PCP or here as needed.  Reviewed expectations re: course of current medical issues. Questions answered. Outlined signs and symptoms indicating need for more acute intervention. Patient verbalized understanding. After Visit Summary given.   SUBJECTIVE: History from: patient.  Brenda Little is a 48 y.o. female who reports being diagnosed with COVID-19 on 08/28/2019. Fairly significant symptoms at onset; questions febrile at that time with body aches and "a really bad cough". Reports telemedicine visit with her PCP who preferred she come in for someone to lay eyes on her. No fevers suspected within the past week. Still coughing but not as much; dry. Does report occasional feeling of SOB; mainly with coughing episodes. None at rest. No CP reported. Normal PO intake without n/v. Ambulatory without difficulty. No abdominal pain. No LE edema. OTC cough medication is helping some. Still fatigued. No wheezing reported. No specific aggravating or alleviating factors reported.  Social History   Tobacco Use  Smoking Status Never Smoker  Smokeless Tobacco Never Used    Increased blood pressure noted today. Reports that she has not been treated for hypertension in the past.  She reports no chest pain on exertion, no dyspnea on exertion, no swelling of ankles, no orthostatic dizziness or lightheadedness, no orthopnea or paroxysmal nocturnal dyspnea, no palpitations and no intermittent claudication symptoms.  ROS: As per HPI. All other systems negative.   OBJECTIVE:  Vitals:   09/09/19 1816 09/09/19 1818  BP:  (!) 180/98  Pulse:  78  Resp:  18  Temp:  97.8 F (36.6 C)  SpO2:  100%  Weight: 117.9 kg      General appearance: alert; appears fatigued; NAD HEENT: nasal congestion; clear runny nose; throat irritation secondary to post-nasal drainage Neck: supple without LAD CV: RRR Lungs: overall she is moving air well; question slightly coarse breath sounds at bases that clear with a few deep breathes; unlabored respirations, symmetrical air entry without wheezing; cough: moderate and dry; speaks full sentences without difficulty Abd: soft Ext: no LE edema Skin: warm and dry Psychological: alert and cooperative; normal mood and affect  Imaging: Dg Chest 2 View  Result Date: 09/09/2019 CLINICAL DATA:  Cough and shortness of breath. EXAM: CHEST - 2 VIEW COMPARISON:  11/01/2008 FINDINGS: Patchy densities in both lungs are concerning for pneumonia. Large lucency in the retrocardiac region probably represents a hiatal hernia. No large pleural effusions. Heart size is within normal limits. IMPRESSION: 1. Patchy bilateral lung densities. Findings are concerning for bilateral pneumonia. 2. Probable hiatal hernia. Electronically Signed   By: Markus Daft M.D.   On: 09/09/2019 19:13    Allergies  Allergen Reactions  .  Latex     blisters  . Percocet [Oxycodone-Acetaminophen] Itching    Turns red  . Codeine Hives, Itching and Rash    Past Medical History:  Diagnosis Date  . Abdominal pain, other specified site   . ADD (attention deficit hyperactivity  disorder, inattentive type)   . Allergy   . Anemia    had iron infusion  . Asthma   . Bronchitis   . 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 Constipation   . Depression   . Diverticulitis   . Dysrhythmia    controlled irregular heart beat  . FHx: BRCA2 gene positive    BRCA2 mutation in maternal aunt  . Fibromyalgia   . GERD (gastroesophageal reflux disease)   . H/O hiatal hernia    hx of   . HSV-2 (herpes simplex virus 2) infection    PER BLOOD TEST  . Hyperlipidemia    history of  . Migraine   . Nausea and vomiting   . PCOS (polycystic ovarian syndrome)   . Pneumonia   . PVC's (premature ventricular contractions)    DR.LITTLE, now followed by primary Dr.Knapp [phone]  . Uterine fibroid   . Vitamin D deficiency    Family History  Problem Relation Age of Onset  . Hypertension Mother   . Fibromyalgia Mother   . Heart attack Father   . Sarcoidosis Father   . ADD / ADHD Sister   . Asthma Brother   . ADD / ADHD Brother   . Breast cancer Maternal Aunt 38       Recurrence 58; BRCA2 mutation  . Breast cancer Maternal Aunt 44       Currently 14; genetic testing pending  . Cancer Other        mat mat great uncle; head/neck ca in 16s  . Cancer Other        mat mat great grandmother; possible stomach cancer   Social History   Socioeconomic History  . Marital status: Single    Spouse name: Not on file  . Number of children: Not on file  . Years of education: Not on file  . Highest education level: Not on file  Occupational History  . Not on file  Social Needs  . Financial resource strain: Not on file  . Food insecurity    Worry: Not on file    Inability: Not on file  . Transportation needs    Medical: Not on file    Non-medical: Not on file  Tobacco Use  . Smoking status: Never Smoker  . Smokeless tobacco: Never Used  Substance and Sexual Activity  . Alcohol use: Yes    Comment: social,1-2 drinks every other week  . Drug use: No  . Sexual activity: Not  Currently    Birth control/protection: Pill  Lifestyle  . Physical activity    Days per week: Not on file    Minutes per session: Not on file  . Stress: Not on file  Relationships  . Social Herbalist on phone: Not on file    Gets together: Not on file    Attends religious service: Not on file    Active member of club or organization: Not on file    Attends meetings of clubs or organizations: Not on file    Relationship status: Not on file  . Intimate partner violence    Fear of current or ex partner: Not on file    Emotionally abused: Not on  file    Physically abused: Not on file    Forced sexual activity: Not on file  Other Topics Concern  . Not on file  Social History Narrative   Currently unemployed (laid off from [employer])           Vanessa Kick, MD 09/10/19 1014

## 2019-09-15 MED FILL — FAMOTIDINE 40 MG TABLET: 40 | 30 days supply | Qty: 30 | Fill #0

## 2019-09-15 MED FILL — ALBUTEROL SULFATE HFA 108 (: 108 (90 BAS | 17 days supply | Qty: 9 | Fill #0

## 2019-09-15 MED FILL — ALBUTEROL SUL 2.5 MG/3 ML S: (2.5 MG/3ML | 4 days supply | Qty: 75 | Fill #0

## 2019-09-15 MED FILL — FLUTICASONE PROP 50 MCG SPR: 50 | 30 days supply | Qty: 16 | Fill #0

## 2019-09-17 MED FILL — AMPHETAMINE-DEXTROAMPHET ER: 30 | 30 days supply | Qty: 30 | Fill #0

## 2019-09-21 MED FILL — buPROPion HCL ER (XL) 150 M: 150 | 30 days supply | Qty: 30 | Fill #4

## 2019-09-29 MED FILL — AMOX-CLAV 875-125 MG TABLET: 875-125 | 10 days supply | Qty: 20 | Fill #0

## 2019-10-01 MED FILL — METOPROLOL SUCCINATE ER 50: 50 | 30 days supply | Qty: 45 | Fill #2

## 2019-10-02 MED FILL — VALACYCLOVIR HCL 500 MG TAB: 500 | 30 days supply | Qty: 30 | Fill #0

## 2019-10-02 MED FILL — OMEPRAZOLE 40 MG CPDR: 40 | 30 days supply | Qty: 30 | Fill #0

## 2019-10-02 MED FILL — MONTELUKAST SOD 10 MG TAB: 10 | 30 days supply | Qty: 30 | Fill #0

## 2019-10-02 MED FILL — FLUoxetine HCL 40 MG CAPS: 40 | 30 days supply | Qty: 30 | Fill #0

## 2019-10-02 MED FILL — LEVOCETIRIZINE 5 MG TABLET: 5 | 30 days supply | Qty: 30 | Fill #0

## 2019-10-28 MED FILL — METOPROLOL SUCCINATE ER 50: 50 | 30 days supply | Qty: 45 | Fill #3

## 2019-10-28 MED FILL — buPROPion HCL ER (XL) 150 M: 150 | 30 days supply | Qty: 30 | Fill #5

## 2019-10-29 MED FILL — AMPHETAMINE-DEXTROAMPHET ER: 30 | 30 days supply | Qty: 30 | Fill #0

## 2019-11-03 MED FILL — LEVOCETIRIZINE 5 MG TABLET: 5 | 30 days supply | Qty: 30 | Fill #1

## 2019-11-03 MED FILL — OMEPRAZOLE 40 MG CPDR: 40 | 30 days supply | Qty: 30 | Fill #1

## 2019-11-03 MED FILL — MONTELUKAST SOD 10 MG TAB: 10 | 30 days supply | Qty: 30 | Fill #1

## 2019-11-03 MED FILL — VALACYCLOVIR HCL 500 MG TAB: 500 | 30 days supply | Qty: 30 | Fill #1

## 2019-11-03 MED FILL — FLUoxetine HCL 40 MG CAPS: 40 | 30 days supply | Qty: 30 | Fill #1

## 2019-11-24 MED FILL — ALBUTEROL SULFATE HFA 108 (: 108 (90 BAS | 17 days supply | Qty: 9 | Fill #0

## 2019-11-24 MED FILL — METOPROLOL SUCCINATE ER 50: 50 | 30 days supply | Qty: 45 | Fill #4

## 2019-11-24 MED FILL — buPROPion HCL ER (XL) 150 M: 150 | 30 days supply | Qty: 30 | Fill #0

## 2019-11-26 MED FILL — AMPHETAMINE-DEXTROAMPHET ER: 30 | 30 days supply | Qty: 30 | Fill #0

## 2019-12-03 MED FILL — MONTELUKAST SOD 10 MG TAB: 10 | 30 days supply | Qty: 30 | Fill #2

## 2019-12-03 MED FILL — LEVOCETIRIZINE 5 MG TABLET: 5 | 30 days supply | Qty: 30 | Fill #2

## 2019-12-03 MED FILL — VALACYCLOVIR HCL 500 MG TAB: 500 | 30 days supply | Qty: 30 | Fill #2

## 2019-12-03 MED FILL — OMEPRAZOLE 40 MG CPDR: 40 | 30 days supply | Qty: 30 | Fill #2

## 2019-12-03 MED FILL — FLUoxetine HCL 40 MG CAPS: 40 | 30 days supply | Qty: 30 | Fill #2

## 2019-12-23 MED FILL — buPROPion HCL ER (XL) 150 M: 150 | 30 days supply | Qty: 30 | Fill #1

## 2019-12-23 MED FILL — METOPROLOL SUCCINATE ER 50: 50 | 30 days supply | Qty: 45 | Fill #5

## 2019-12-29 MED FILL — AMPHETAMINE-DEXTROAMPHET ER: 30 | 30 days supply | Qty: 30 | Fill #0

## 2020-07-13 MED FILL — AMPHETAMINE-DEXTROAMPHET ER: 30 | 30 days supply | Qty: 30 | Fill #0

## 2020-07-13 MED FILL — DEXTROAMP-AMPHETAMINE 5 MG: 5 | 30 days supply | Qty: 30 | Fill #0
